# Patient Record
Sex: Male | Born: 1995 | Hispanic: No | Marital: Single | Smoking: Never smoker
Health system: Southern US, Community
[De-identification: ages and names within clinical notes are randomized; demographics above are authoritative.]

## PROBLEM LIST (undated history)

## (undated) DIAGNOSIS — K4090 Unilateral inguinal hernia, without obstruction or gangrene, not specified as recurrent: Secondary | ICD-10-CM

## (undated) DIAGNOSIS — K5900 Constipation, unspecified: Secondary | ICD-10-CM

## (undated) DIAGNOSIS — J302 Other seasonal allergic rhinitis: Secondary | ICD-10-CM

## (undated) DIAGNOSIS — F909 Attention-deficit hyperactivity disorder, unspecified type: Secondary | ICD-10-CM

## (undated) HISTORY — PX: ABDOMINAL SURGERY: SHX537

## (undated) HISTORY — PX: HERNIA REPAIR: SHX51

## (undated) HISTORY — PX: CIRCUMCISION: SUR203

---
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
## 2016-01-06 ENCOUNTER — Encounter: Payer: Self-pay | Admitting: Emergency Medicine

## 2016-01-06 ENCOUNTER — Emergency Department
Admission: EM | Admit: 2016-01-06 | Discharge: 2016-01-06 | Disposition: A | Discharge status: Home / Self Care | Payer: Commercial Managed Care - PPO | Source: Home / Self Care | Attending: Emergency Medicine | Admitting: Emergency Medicine

## 2016-01-06 DIAGNOSIS — S91209A Unspecified open wound of unspecified toe(s) with damage to nail, initial encounter: Secondary | ICD-10-CM

## 2016-01-06 MED ORDER — SULFAMETHOXAZOLE-TRIMETHOPRIM 800-160 MG PO TABS: 1.0000 | ORAL_TABLET | Freq: Two times a day (BID) | ORAL | 0 refills | Status: AC

## 2016-01-06 MED ORDER — NAPROXEN 375 MG PO TABS: 375.0000 mg | ORAL_TABLET | Freq: Two times a day (BID) | ORAL | 0 refills | Status: DC

## 2016-01-06 NOTE — ED Triage Notes (Signed)
[facility] with [facility]/O injury of left great toe. Another person stepped on the toe nail lat night. Left great toe nail is torn from the nail bed.

## 2016-01-07 NOTE — ED Provider Notes (Signed)
[facility]-EMERG[facility]Y DEPT Provider Note   CSN: [id number] Arrival date & time: 01/06/16  1542     History   Chief Complaint Chief Complaint  Patient presents with  . Nail Problem    HPI Nathaniel Lang is a 20 y.o. male.  The history is provided by the patient and a parent. No language interpreter was used.  Toe Pain  This is a new problem. The current episode started yesterday. The problem occurs constantly. The problem has been gradually worsening. Nothing aggravates the symptoms. Nothing relieves the symptoms. He has tried nothing for the symptoms.   Pt was at a concert and someone stepped on his toe,  Pulling nail out and loosening.  Injury appears isolated to toe nail,  Toe not injured Past Medical History:  Diagnosis Date  . ADHD (attention deficit hyperactivity disorder)   . Constipation   . Inguinal hernia   . Seasonal allergies     Patient Active Problem List   Diagnosis Date Noted  . 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 Testis mass 10/15/2014  . ADD (attention deficit disorder) 03/17/2006  . Allergic rhinitis, seasonal 03/17/2006  . 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 ABDOMINAL SURGERY    . CIRCUMCISION    . HERNIA REPAIR    . ORCHIECTOMY Left 10/15/2014   Procedure: left ORCHIECTOMY and right orchiodopexy;  Surgeon: Jethro Bolus, MD;  Location: [facility];  Service: Urology;  Laterality: Left;  . TESTICULAR EXPLORATION N/A 10/15/2014   Procedure: TESTICULAR EXPLORATION;  Surgeon: Jethro Bolus, MD;  Location: [facility];  Service: Urology;  Laterality: N/A;       Home Medications    Prior to Admission medications   Medication Sig Start Date End Date Taking? Authorizing Provider  naproxen (NAPROSYN) 375 MG tablet Take 1 tablet (375 mg total) by mouth 2 (two) times daily. 01/06/16   Elson Areas, PA-C  sulfamethoxazole-trimethoprim (BACTRIM DS,SEPTRA DS)  800-160 MG tablet Take 1 tablet by mouth 2 (two) times daily. 01/06/16 01/13/16  Elson Areas, PA-C    Family History History reviewed. No pertinent family history.  Social History Social History  Substance Use Topics  . Smoking status: Never Smoker  . Smokeless tobacco: Never Used  . Alcohol use No     Allergies   Pertussis vaccines   Review of Systems Review of Systems  All other systems reviewed and are negative.    Physical Exam Updated Vital Signs BP 114/67 (BP Location: Left Arm)   Pulse 89   Temp 97.9 F (36.6 C) (Oral)   Resp 16   Ht 5\' 3"  (1.6 m)   Wt 45.7 kg   SpO2 100%   BMI 17.85 kg/m   Physical Exam  Constitutional: He appears well-developed and well-nourished.  HENT:  Head: Normocephalic and atraumatic.  Eyes: Conjunctivae are normal.  Cardiovascular:  No murmur heard. Pulmonary/Chest: No respiratory distress.  Abdominal: There is no tenderness.  Musculoskeletal: He exhibits no edema.  Neurological: He is alert.  Skin: Skin is warm and dry.  Psychiatric: He has a normal mood and affect.  Nursing note and vitals reviewed.    ED Treatments / Results  Labs (all labs ordered are listed, but only abnormal results are displayed) Labs Reviewed - No data to display  EKG  EKG Interpretation None       Radiology No results found.  Procedures .Nail Removal Date/Time: 01/07/2016 5:23  PM Performed by: Elson AreasSOFIA, Hildy Nicholl K Authorized by: Elson AreasSOFIA, Shoji Pertuit K   Consent:    Consent obtained:  Verbal   Consent given by:  Patient Location:    Foot:  L big toe Pre-procedure details:    Skin preparation:  Betadine Anesthesia (see MAR for exact dosages):    Anesthesia method:  Nerve block   Block needle gauge:  27 G   Block anesthetic:  Bupivacaine 0.5% w/o epi   Block technique:  Digital   Block injection procedure:  Anatomic landmarks identified Nail Removal:    Nail removed:  Complete Post-procedure details:    Dressing:  4x4 sterile  gauze   Patient tolerance of procedure:  Tolerated well, no immediate complications   (including critical care time)  Medications Ordered in ED Medications - No data to display   Initial Impression / Assessment and Plan / ED Course  I have reviewed the triage vital signs and the nursing notes.  Pertinent labs & imaging results that were available during my care of the patient were reviewed by me and considered in my medical decision making (see chart for details).  Clinical Course      Final Clinical Impressions(s) / ED Diagnoses   Final diagnoses:  Avulsion of toenail of left foot    New Prescriptions Discharge Medication List as of 01/06/2016  4:52 PM    START taking these medications   Details  naproxen (NAPROSYN) 375 MG tablet Take 1 tablet (375 mg total) by mouth 2 (two) times daily., Starting Sun 01/06/2016, Print    sulfamethoxazole-trimethoprim (BACTRIM DS,SEPTRA DS) 800-160 MG tablet Take 1 tablet by mouth 2 (two) times daily., Starting Sun 01/06/2016, Until Sun 01/13/2016, Print      I counseled on nail injury,   Pt has bilat nail deformities,  Atrophic nails.   An After Visit Summary was printed and given to the patient.   Elson AreasLeslie K Rosalynn Sergent, PA-C 01/07/16 1726

## 2016-04-18 ENCOUNTER — Encounter: Payer: Self-pay | Admitting: *Deleted

## 2016-04-18 ENCOUNTER — Emergency Department
Admission: EM | Admit: 2016-04-18 | Discharge: 2016-04-18 | Disposition: A | Discharge status: Home / Self Care | Payer: Commercial Managed Care - PPO | Source: Home / Self Care | Attending: Family Medicine | Admitting: Family Medicine

## 2016-04-18 ENCOUNTER — Other Ambulatory Visit: Payer: Self-pay | Admitting: Family Medicine

## 2016-04-18 DIAGNOSIS — Z113 Encounter for screening for infections with a predominantly sexual mode of transmission: Secondary | ICD-10-CM

## 2016-04-18 NOTE — ED Provider Notes (Signed)
[facility]    CSN: [id number] Arrival date & time: 04/18/16  1508     History   Chief Complaint Chief Complaint  Patient presents with  . STD Testing    HPI Nathaniel Lang is a 21 y.o. male.   Patient presents for STD screening, and is assymptomatic at present.  He reports that he has just started a new relationship, although has not had a sexual relationship with new partner.  His previous partner was assymptomatic.   No past history of STD.   The history is provided by the patient.    Past Medical History:  Diagnosis Date  . ADHD (attention deficit hyperactivity disorder)   . Constipation   . Inguinal hernia   . Seasonal allergies     Patient Active Problem List   Diagnosis Date Noted  . 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 Testis mass 10/15/2014  . ADD (attention deficit disorder) 03/17/2006  . Allergic rhinitis, seasonal 03/17/2006  . 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 ABDOMINAL SURGERY    . CIRCUMCISION    . HERNIA REPAIR    . ORCHIECTOMY Left 10/15/2014   Procedure: left ORCHIECTOMY and right orchiodopexy;  Surgeon: Jethro BolusSigmund Tannenbaum, MD;  Location: [facility];  Service: Urology;  Laterality: Left;  . TESTICULAR EXPLORATION N/A 10/15/2014   Procedure: TESTICULAR EXPLORATION;  Surgeon: Jethro BolusSigmund Tannenbaum, MD;  Location: [facility];  Service: Urology;  Laterality: N/A;       Home Medications    Prior to Admission medications   Not on File    Family History History reviewed. No pertinent family history.  Social History Social History  Substance Use Topics  . Smoking status: Never Smoker  . Smokeless tobacco: Never Used  . Alcohol use No     Allergies   Pertussis vaccines   Review of Systems Review of Systems  Constitutional: Negative for activity change, appetite change, chills, diaphoresis, fatigue, fever and unexpected weight change.   HENT: Negative.   Eyes: Negative.   Respiratory: Negative.   Cardiovascular: Negative.   Gastrointestinal: Negative.   Genitourinary: Negative.   Musculoskeletal: Negative.   Skin: Negative.   Neurological: Negative.   Hematological: Negative for adenopathy.     Physical Exam Triage Vital Signs ED Triage Vitals  Enc Vitals Group     BP 04/18/16 1614 117/73     Pulse Rate 04/18/16 1614 80     Resp --      Temp 04/18/16 1614 98.4 F (36.9 C)     Temp Source 04/18/16 1614 Oral     SpO2 04/18/16 1614 97 %     Weight 04/18/16 1615 99 lb (44.9 kg)     Height --      Head Circumference --      Peak Flow --      Pain Score 04/18/16 1615 0     Pain Loc --      Pain Edu? --      Excl. in GC? --    No data found.   Updated Vital Signs BP 117/73 (BP Location: Left Arm)   Pulse 80   Temp 98.4 F (36.9 C) (Oral)   Wt 99 lb (44.9 kg)   SpO2 97%   BMI 17.54 kg/m   Visual Acuity Right Eye Distance:   Left Eye Distance:   Bilateral Distance:    Right Eye Near:   Left  Eye Near:    Bilateral Near:     Physical Exam Nursing notes and Vital Signs reviewed. Appearance:  Patient appears stated age, and in no acute distress Eyes:  Pupils are equal, round, and reactive to light and accomodation.  Extraocular movement is intact.  Conjunctivae are not inflamed  Ears:  Normal externally. Nose: Normal Pharynx:  Normal Neck:  Supple.  No adenopathy. Lungs:  Clear to auscultation.  Breath sounds are equal.  Moving air well. Heart:  Regular rate and rhythm without murmurs, rubs, or gallops.  Abdomen:  Nontender without masses or hepatosplenomegaly.  Bowel sounds are present.  No CVA or flank tenderness.  Extremities:  No edema.  Skin:  No rash present.    UC Treatments / Results  Labs (all labs ordered are listed, but only abnormal results are displayed) Labs Reviewed  GC/CHLAMYDIA PROBE AMP  HSV(HERPES SMPLX)ABS-I+II(IGG+IGM)-BLD  HIV ANTIBODY (ROUTINE TESTING)  RPR     EKG  EKG Interpretation None       Radiology No results found.  Procedures Procedures (including critical care time)  Medications Ordered in UC Medications - No data to display   Initial Impression / Assessment and Plan / UC Course  I have reviewed the triage vital signs and the nursing notes.  Pertinent labs & imaging results that were available during my care of the patient were reviewed by me and considered in my medical decision making (see chart for details).    Patient is completely assymptomatic at present. Check GC/chlamydia, RPR, HSV 1 & 2; HIV    Final Clinical Impressions(s) / UC Diagnoses   Final diagnoses:  Screen for STD (sexually transmitted disease)    New Prescriptions New Prescriptions   No medications on file     Lattie Haw, MD 04/18/16 2008

## 2016-04-18 NOTE — ED Triage Notes (Addendum)
Patient is here for full std screening. He is asymptomatic.   PASSWORD FOR RESULTS: [facility] [id number]

## 2016-04-19 LAB — HIV ANTIBODY (ROUTINE TESTING W REFLEX): HIV 1&2 Ab, 4th Generation: NONREACTIVE

## 2016-04-19 LAB — RPR

## 2016-04-20 ENCOUNTER — Telehealth: Payer: Self-pay | Admitting: Emergency Medicine

## 2016-04-20 NOTE — Telephone Encounter (Signed)
Pt called regarding his results.  HIV and RPR results back and given to pt.  Other labs still pending.  TMartin,CMA

## 2016-04-21 LAB — GC/CHLAMYDIA PROBE AMP
CT Probe RNA: NOT DETECTED
GC Probe RNA: NOT DETECTED

## 2016-04-22 ENCOUNTER — Telehealth: Payer: Self-pay | Admitting: *Deleted

## 2016-04-22 NOTE — Telephone Encounter (Signed)
Password received, advised of all negative results thus far. HSV blood work is still pending.

## 2016-04-22 NOTE — Telephone Encounter (Addendum)
[facility] stating that there was a "specimen problem". They were questioning if the only blood work needed was HIV and RPR. Per Zella Ballobin she did not see an order for the HSV Igg and Igm. Robin added the lab test.

## 2016-04-25 LAB — HSV TYPE I/II IGG, IGMW/ REFLEX
HSV 1 IGM SCREEN: NEGATIVE
HSV 2 IgM Screen: NEGATIVE

## 2016-04-26 ENCOUNTER — Telehealth: Payer: Self-pay | Admitting: Emergency Medicine

## 2016-08-13 IMAGING — CR DG ABDOMEN 2V
2 series · 2 of 2 positions shown · non-contrast
Comparison: None.

CLINICAL DATA: 18-year-old male with mid and left-sided abdominal
pain as well as nausea

EXAM:
ABDOMEN - 2 VIEW

[w abdomen upright]
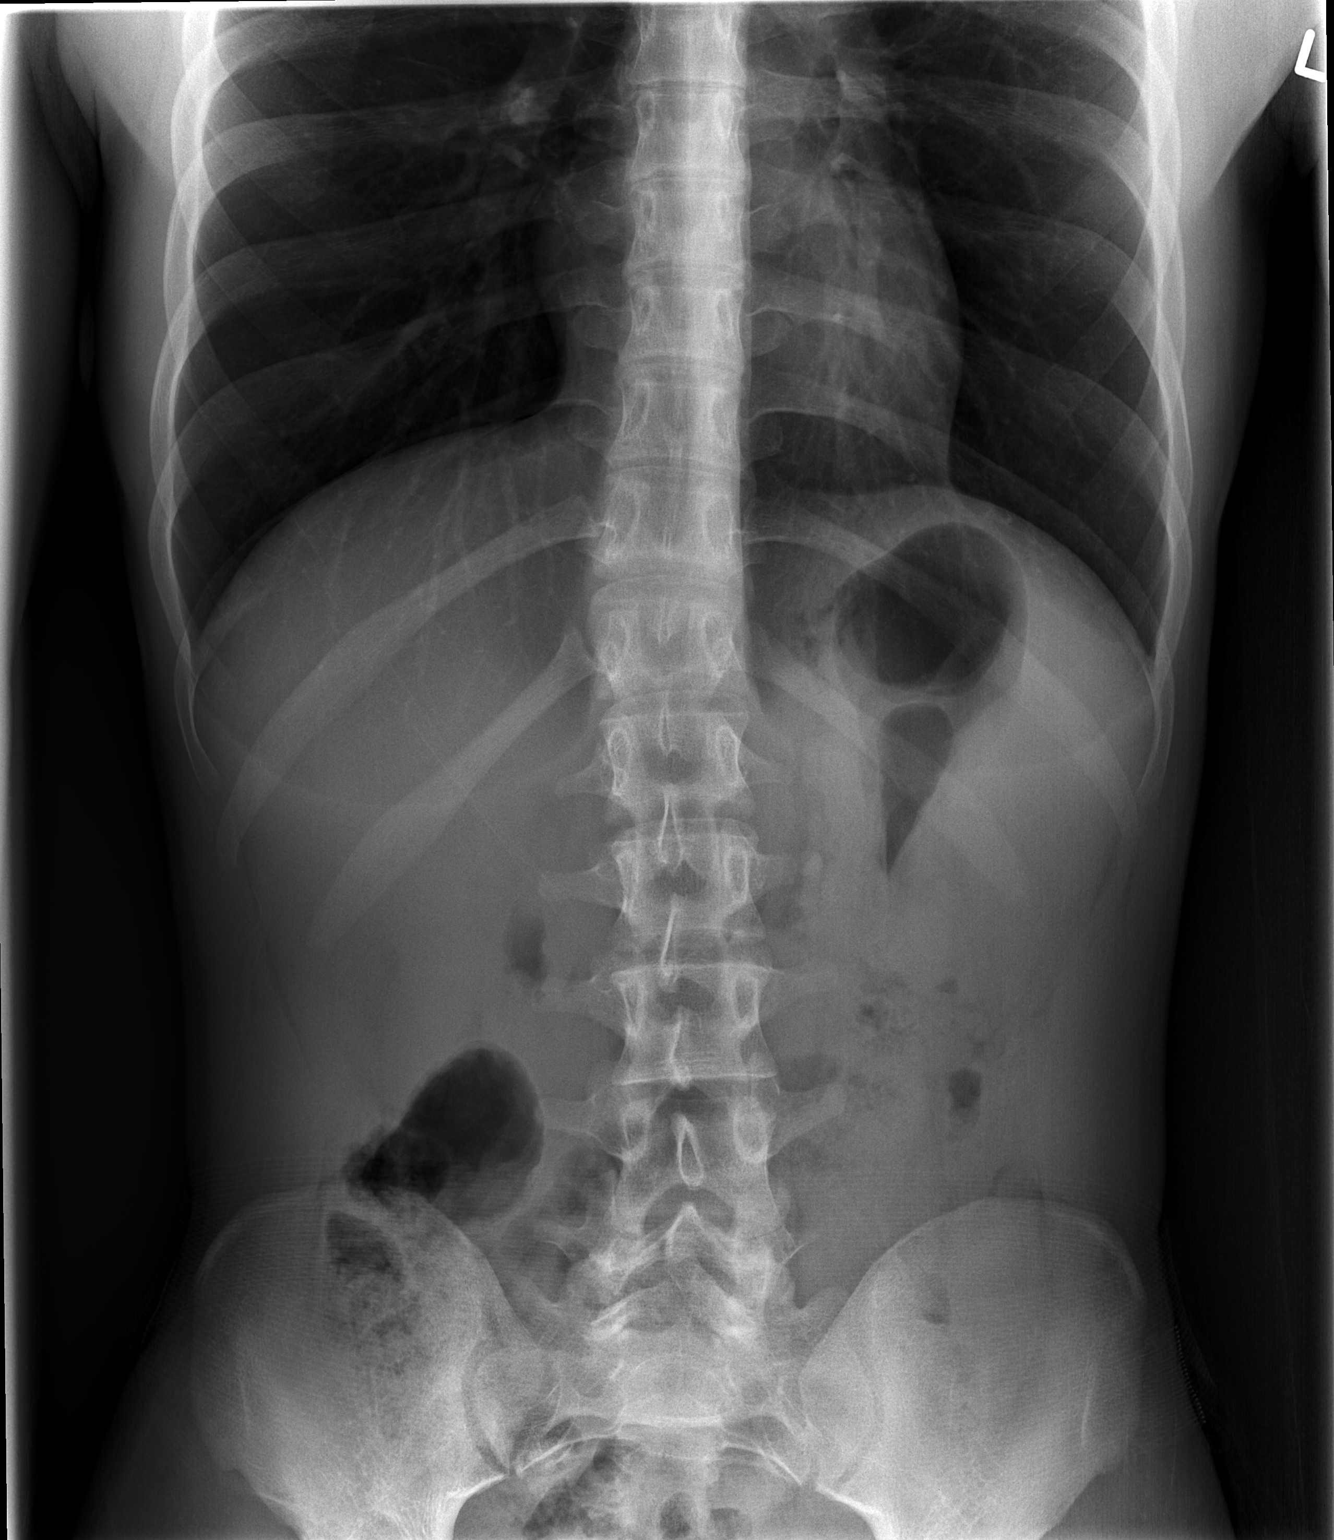

[t abdomen supine]
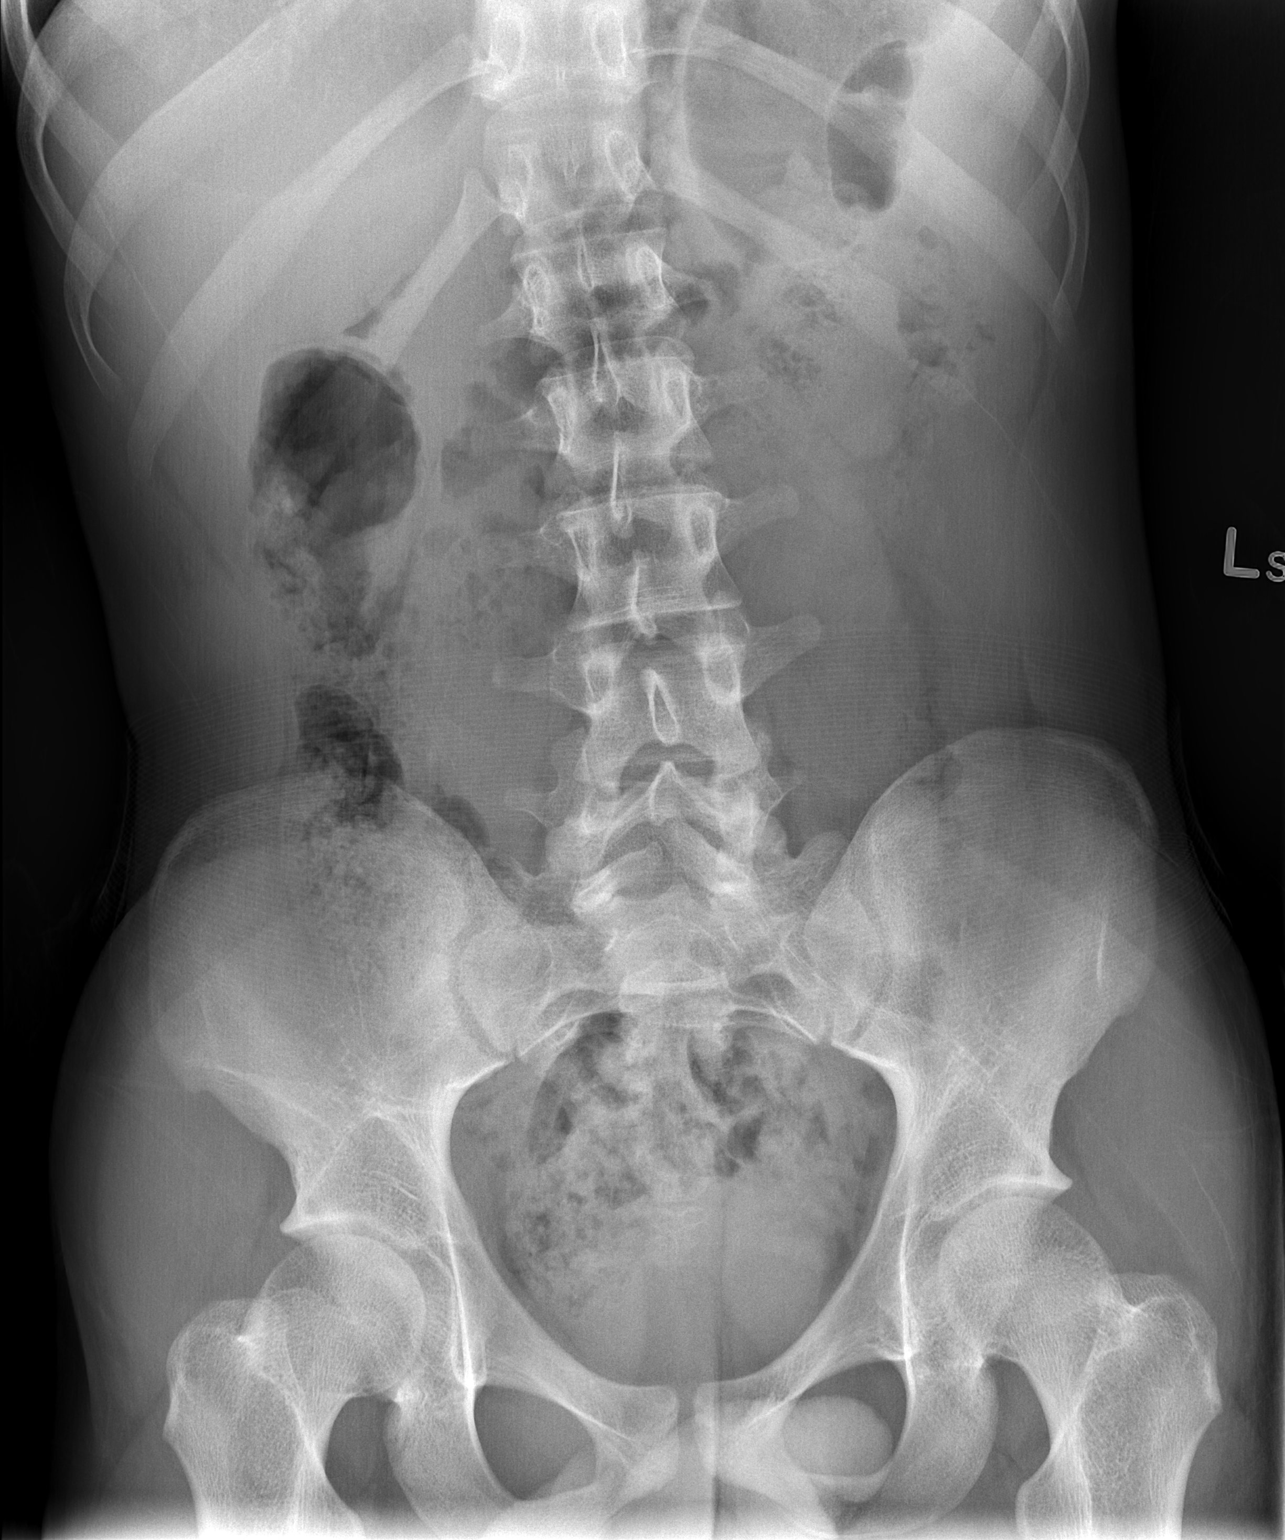

[2 of 2 positions shown; findings below may reference images not displayed]

FINDINGS: The bowel gas pattern is normal. Unremarkable colonic stool burden.
There is no evidence of free air. No radio-opaque calculi or other
significant radiographic abnormality is seen.
IMPRESSION: Negative.

## 2016-08-30 IMAGING — US US SCROTUM
1 series · 13 of 25 positions shown · non-contrast
Comparison: None.

CLINICAL DATA: Left testicular swelling and pain, sudden onset
yesterday. Tender to palpation.

EXAM:
SCROTAL ULTRASOUND
DOPPLER ULTRASOUND OF THE TESTICLES
TECHNIQUE: Complete ultrasound examination of the testicles, epididymis, and
other scrotal structures was performed. Color and spectral Doppler
ultrasound were also utilized to evaluate blood flow to the
testicles.

[Series 1: us scrotum · 0.08mm/px · 78 acquisitions, 13 frames shown]
[im 1/78]
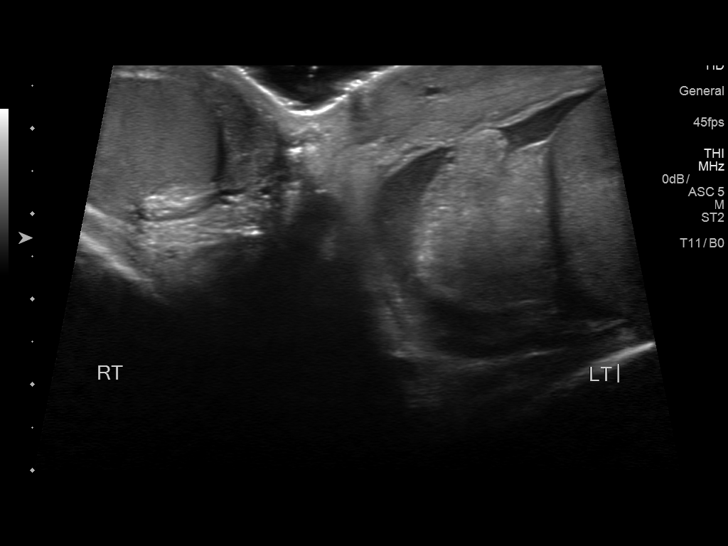
[im 7/78]
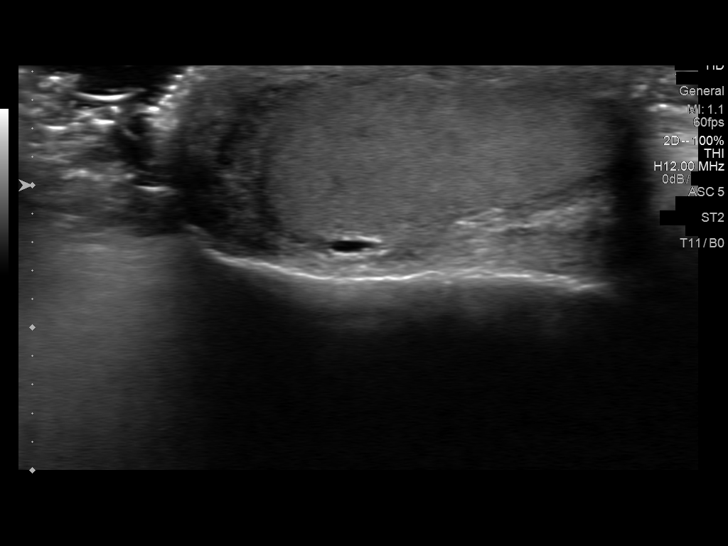
[im 13/78]
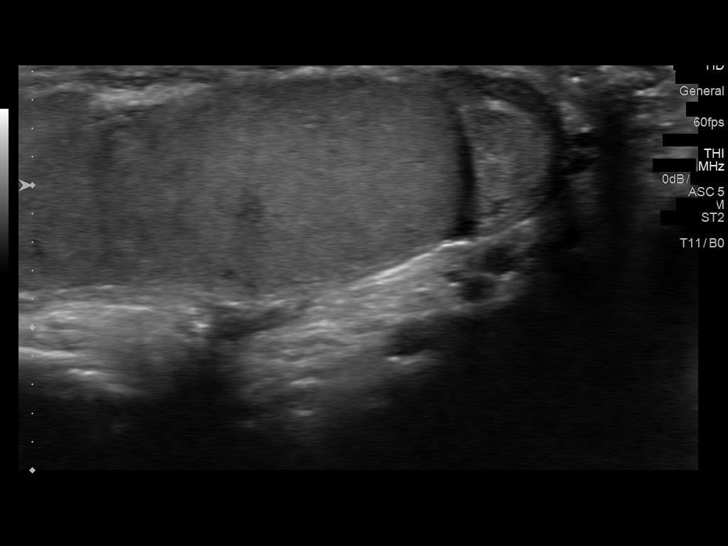
[im 20/78]
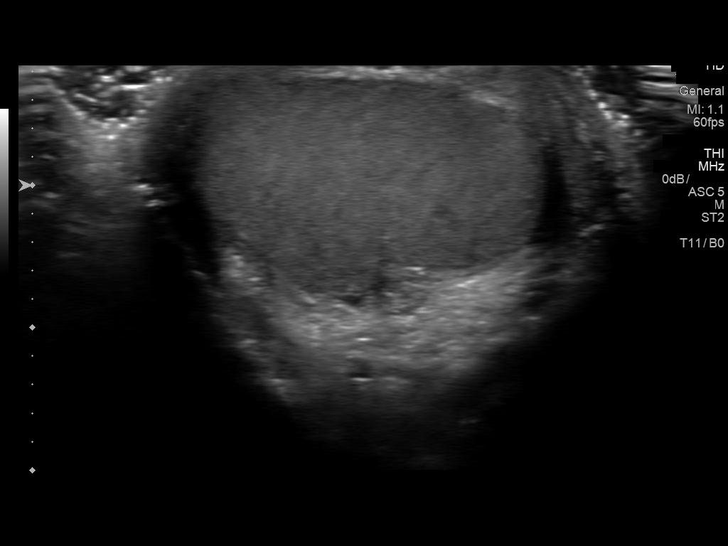
[im 26/78]
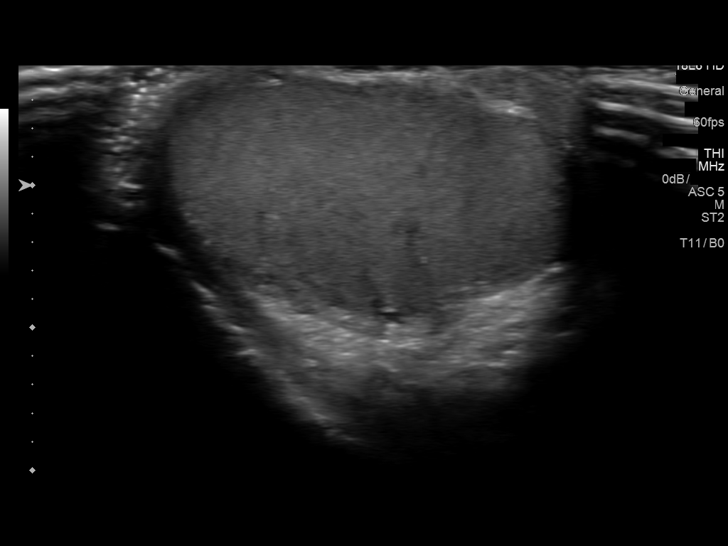
[im 33/78]
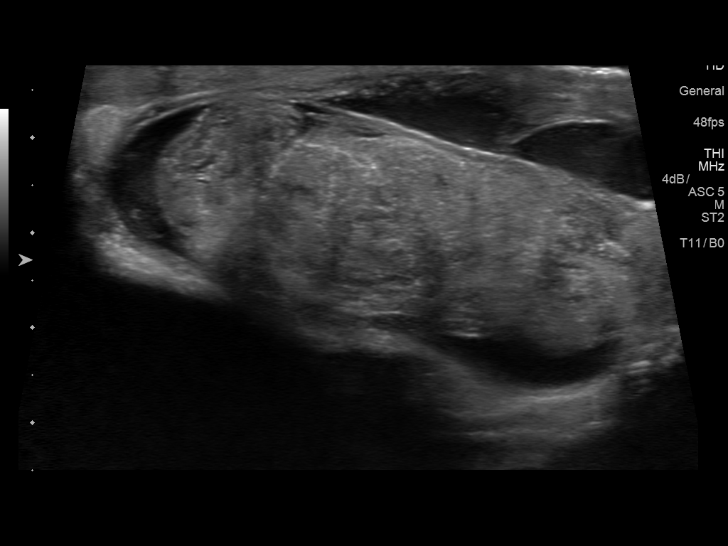
[im 39/78]
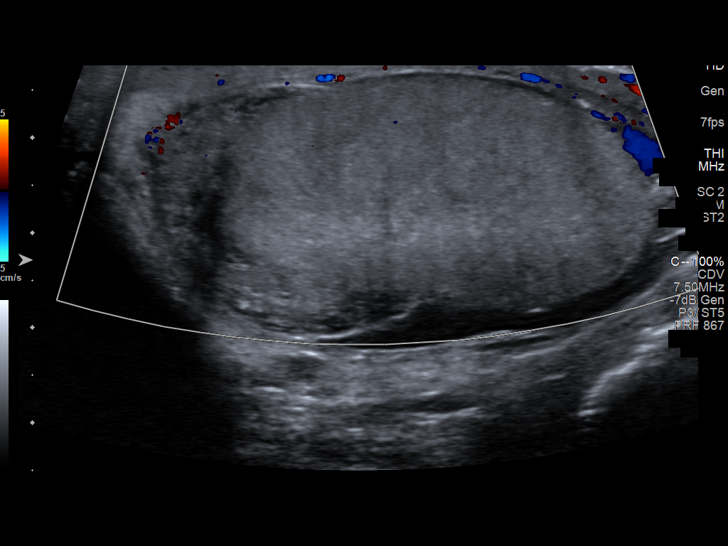
[im 45/78]
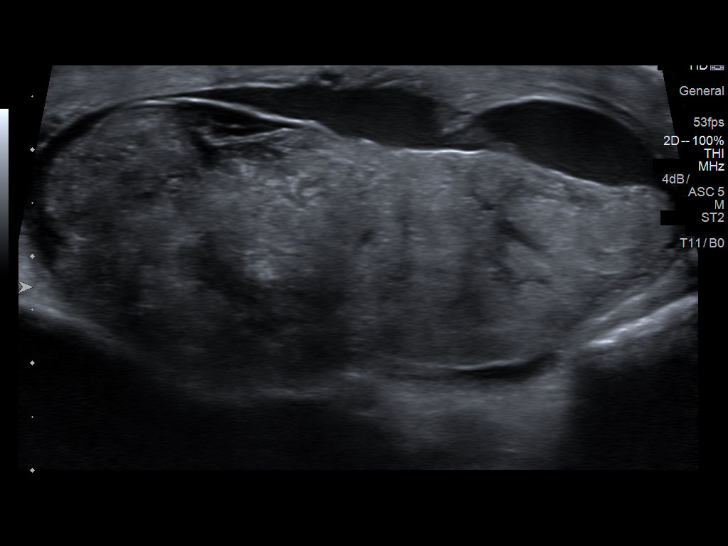
[im 52/78]
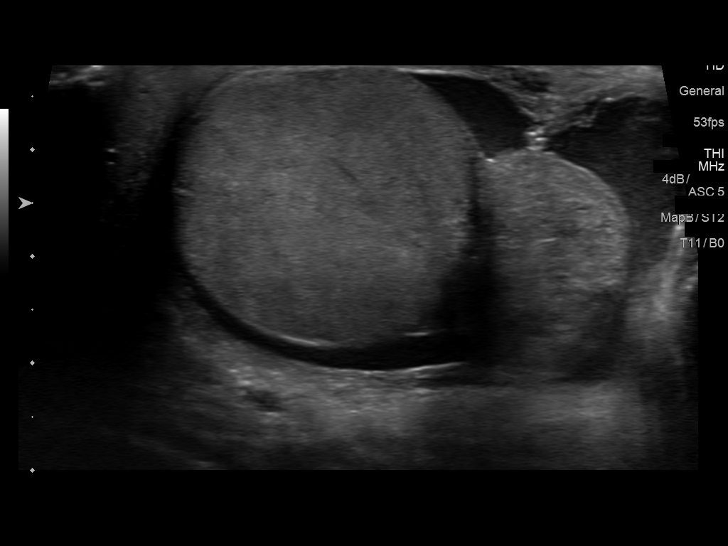
[im 58/78]
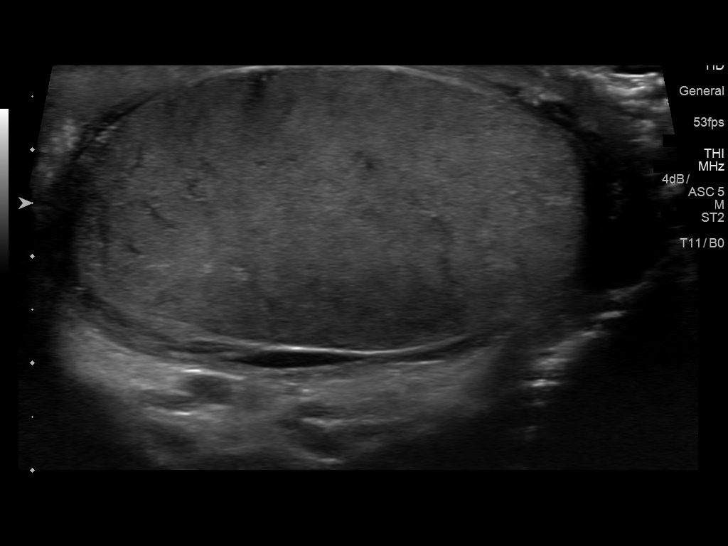
[im 65/78]
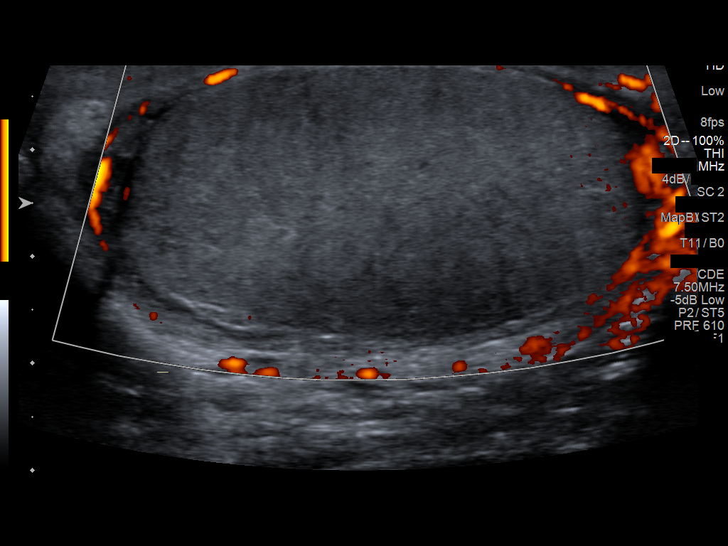
[im 71/78]
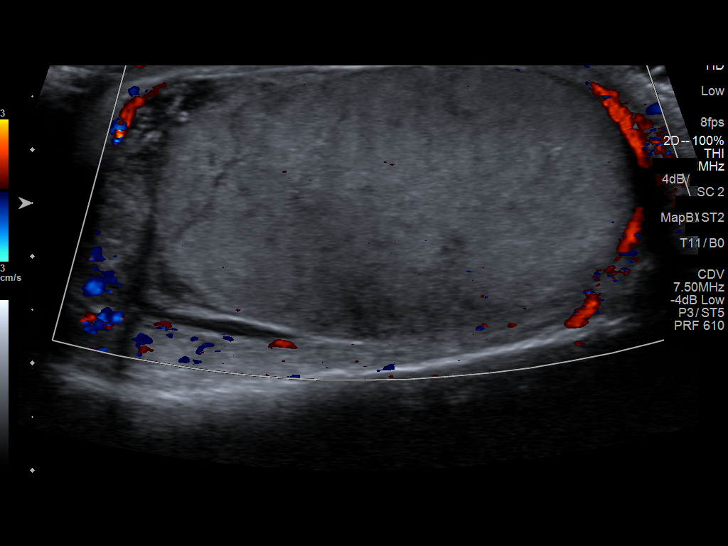
[im 78/78]
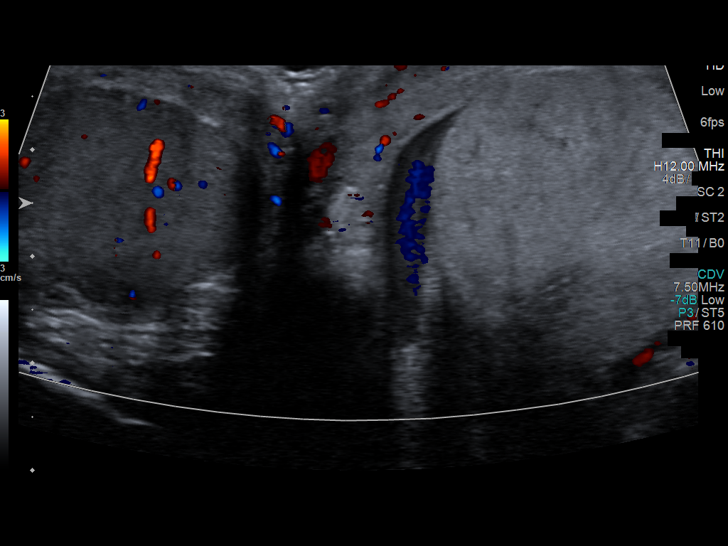

[13 of 25 positions shown; findings below may reference images not displayed]

FINDINGS: Right testicle

Measurements: 4.1 x 1.4 x 2.5 cm. No mass or microlithiasis
visualized.

Left testicle

Measurements: 4.9 x 2.5 x 2.8 cm. The testis is enlarged and appears
edematous. No arterial flow is detected on Doppler.

Right epididymis:  Normal in size and appearance.

Left epididymis:  The left epididymis is edematous.

Hydrocele:  Small bilateral hydroceles.

Varicocele:  None visualized.

Pulsed Doppler interrogation of both testes demonstrates normal low
resistance arterial and venous waveforms on the right. We were
unable to detect arterial flow on the left.
IMPRESSION: No detectable arterial flow to the left testis or epididymis,
concerning for torsion. These results were called by telephone at
the time of interpretation on 10/14/2014 at [DATE] to Dr. MIKESON VI
ES , who verbally acknowledged these results.

## 2016-11-29 ENCOUNTER — Encounter: Payer: Self-pay | Admitting: Emergency Medicine

## 2016-11-29 ENCOUNTER — Emergency Department
Admission: EM | Admit: 2016-11-29 | Discharge: 2016-11-29 | Disposition: A | Discharge status: Home / Self Care | Payer: Commercial Managed Care - PPO | Source: Home / Self Care | Attending: Family Medicine | Admitting: Family Medicine

## 2016-11-29 DIAGNOSIS — Z202 Contact with and (suspected) exposure to infections with a predominantly sexual mode of transmission: Secondary | ICD-10-CM | POA: Diagnosis not present

## 2016-11-29 MED ORDER — CEFTRIAXONE SODIUM 250 MG IJ SOLR
250.0000 mg | Freq: Once | INTRAMUSCULAR | Status: AC
  Administered 2016-11-29: 250 mg via INTRAMUSCULAR

## 2016-11-29 NOTE — ED Provider Notes (Signed)
[facility]    CSN: [id number] Arrival date & time: 11/29/16  0947     History   Chief Complaint Chief Complaint  Patient presents with  . Exposure to STD    HPI Nathaniel Lang is a 21 y.o. male.   Patient reports that about 2 weeks ago he was possible exposed to [facility]  He is assymptomatic at present but requests treatment.   The history is provided by the patient.    Past Medical History:  Diagnosis Date  . ADHD (attention deficit hyperactivity disorder)   . Constipation   . Inguinal hernia   . Seasonal allergies     Patient Active Problem List   Diagnosis Date Noted  . 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 Testis mass 10/15/2014  . ADD (attention deficit disorder) 03/17/2006  . Allergic rhinitis, seasonal 03/17/2006  . 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 ABDOMINAL SURGERY    . CIRCUMCISION    . HERNIA REPAIR    . ORCHIECTOMY Left 10/15/2014   Procedure: left ORCHIECTOMY and right orchiodopexy;  Surgeon: Jethro Bolus, MD;  Location: [facility];  Service: Urology;  Laterality: Left;  . TESTICULAR EXPLORATION N/A 10/15/2014   Procedure: TESTICULAR EXPLORATION;  Surgeon: Jethro Bolus, MD;  Location: [facility];  Service: Urology;  Laterality: N/A;       Home Medications    Prior to Admission medications   Not on File    Family History History reviewed. No pertinent family history.  Social History Social History  Substance Use Topics  . Smoking status: Never Smoker  . Smokeless tobacco: Never Used  . Alcohol use No     Allergies   Pertussis vaccines   Review of Systems Review of Systems  Constitutional: Negative for chills, diaphoresis, fatigue and fever.  Genitourinary: Negative for discharge, dysuria, flank pain, frequency, genital sores, hematuria, penile swelling, scrotal swelling, testicular pain and urgency.  Musculoskeletal:  Negative for arthralgias.  Skin: Negative for rash.  All other systems reviewed and are negative.    Physical Exam Triage Vital Signs ED Triage Vitals  Enc Vitals Group     BP      Pulse      Resp      Temp      Temp src      SpO2      Weight      Height      Head Circumference      Peak Flow      Pain Score      Pain Loc      Pain Edu?      Excl. in GC?    No data found.   Updated Vital Signs BP 109/70 (BP Location: Left Arm)   Pulse 68   Temp 98.4 F (36.9 C) (Oral)   Resp 16   Ht  (1.6 m)   Wt 100 lb (45.4 kg)   SpO2 100%   BMI 17.71 kg/m   Visual Acuity Right Eye Distance:   Left Eye Distance:   Bilateral Distance:    Right Eye Near:   Left Eye Near:    Bilateral Near:     Physical Exam Nursing notes and Vital Signs reviewed. Appearance:  Patient appears stated age, and in no acute distress.    Eyes:  Pupils are equal, round, and reactive to light and accomodation.  Extraocular movement is intact.  Conjunctivae are not inflamed   Pharynx:  Normal; moist mucous membranes  Neck:  Supple.  No adenopathy Lungs:  Clear to auscultation.  Breath sounds are equal.  Moving air well. Heart:  Regular rate and rhythm without murmurs, rubs, or gallops.  Abdomen:  Nontender without masses or hepatosplenomegaly.  Bowel sounds are present.  No CVA or flank tenderness.  Extremities:  No edema.  Skin:  No rash present.     UC Treatments / Results  Labs (all labs ordered are listed, but only abnormal results are displayed) Labs Reviewed  C. TRACHOMATIS/N. GONORRHOEAE RNA  GC/CHLAMYDIA PROBE AMP    EKG  EKG Interpretation None       Radiology No results found.  Procedures Procedures (including critical care time)  Medications Ordered in UC Medications  cefTRIAXone (ROCEPHIN) injection 250 mg (250 mg Intramuscular Given 11/29/16 1121)     Initial Impression / Assessment and Plan / UC Course  I have reviewed the triage vital signs and the  nursing notes.  Pertinent labs & imaging results that were available during my care of the patient were reviewed by me and considered in my medical decision making (see chart for details).    Administered Rocephin  IM GC/chlamydia pending. Avoid sexual contact until test results available. Followup with Family Doctor if not improved in about 5 days.    Final Clinical Impressions(s) / UC Diagnoses   Final diagnoses:  STD exposure    New Prescriptions There are no discharge medications for this patient.        Lattie Haw, MD 12/06/16 [phone]

## 2016-11-29 NOTE — ED Triage Notes (Signed)
Patient was exposed to [place] 2 weeks ago; uncertain symptoms, but requesting treatment.

## 2016-11-29 NOTE — Discharge Instructions (Signed)
Avoid sexual contact until test results available.

## 2016-12-02 ENCOUNTER — Telehealth: Payer: Self-pay | Admitting: *Deleted

## 2016-12-02 LAB — C. TRACHOMATIS/N. GONORRHOEAE RNA
C. trachomatis RNA, TMA: NOT DETECTED
N. gonorrhoeae RNA, TMA: NOT DETECTED

## 2016-12-02 NOTE — Telephone Encounter (Signed)
Spoke to pt password verified and lab results given. Clemens Catholic, LPN

## 2017-11-18 ENCOUNTER — Encounter: Payer: Self-pay | Admitting: *Deleted

## 2017-11-18 ENCOUNTER — Emergency Department (INDEPENDENT_AMBULATORY_CARE_PROVIDER_SITE_OTHER)
Admission: EM | Admit: 2017-11-18 | Discharge: 2017-11-18 | Disposition: A | Discharge status: Home / Self Care | Payer: BLUE CROSS/BLUE SHIELD | Source: Home / Self Care | Attending: Family Medicine | Admitting: Family Medicine

## 2017-11-18 DIAGNOSIS — H15102 Unspecified episcleritis, left eye: Secondary | ICD-10-CM

## 2017-11-18 MED ORDER — KETOROLAC TROMETHAMINE 0.5 % OP SOLN: 1.0000 [drp] | Freq: Four times a day (QID) | OPHTHALMIC | 0 refills | Status: DC

## 2017-11-18 NOTE — Discharge Instructions (Addendum)
Try using refrigerated lubricating eye drops (such as Refresh tears) several times daily.

## 2017-11-18 NOTE — ED Provider Notes (Signed)
[facility]    CSN: [id number] Arrival date & time: 11/18/17  1043     History   Chief Complaint Chief Complaint  Patient presents with  . Eye Problem    HPI Nathaniel Lang is a 22 y.o. male.   Patient complains of onset of mild redness in his left eye 3 days ago, and a gritty sensation in the left eye, now resolved.  He recalls no foreign body to the left eye.  He first noted some morning "crusting" on his eyelids, and later developed mucoid discharge during the day.  No changes in vision.  No sinus congestion.  No recent URI.  He does not wear contact lens.  The history is provided by the patient.    Past Medical History:  Diagnosis Date  . ADHD (attention deficit hyperactivity disorder)   . Constipation   . Inguinal hernia   . Seasonal allergies     Patient Active Problem List   Diagnosis Date Noted  . 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 Testis mass 10/15/2014  . ADD (attention deficit disorder) 03/17/2006  . Allergic rhinitis, seasonal 03/17/2006  . 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 ABDOMINAL SURGERY    . CIRCUMCISION    . HERNIA REPAIR    . ORCHIECTOMY Left 10/15/2014   Procedure: left ORCHIECTOMY and right orchiodopexy;  Surgeon: Jethro Bolus, MD;  Location: [facility];  Service: Urology;  Laterality: Left;  . TESTICULAR EXPLORATION N/A 10/15/2014   Procedure: TESTICULAR EXPLORATION;  Surgeon: Jethro Bolus, MD;  Location: [facility];  Service: Urology;  Laterality: N/A;       Home Medications    Prior to Admission medications   Medication Sig Start Date End Date Taking? Authorizing Provider  ketorolac (ACULAR) 0.5 % ophthalmic solution Place 1 drop into the left eye 4 (four) times daily. 11/18/17   Lattie Haw, MD    Family History Family History  Problem Relation Age of Onset  . Healthy Mother   . Healthy Father     Social  History Social History   Tobacco Use  . Smoking status: Never Smoker  . Smokeless tobacco: Never Used  Substance Use Topics  . Alcohol use: No  . Drug use: No     Allergies   Pertussis vaccines   Review of Systems Review of Systems No sore throat No cough No pleuritic pain No wheezing No nasal congestion No post-nasal drainage No sinus pain/pressure + itchy/red left eye No earache No hemoptysis No SOB No fever/chills No nausea No vomiting No abdominal pain No diarrhea No urinary symptoms No skin rash No fatigue No myalgias No headache    Physical Exam Triage Vital Signs ED Triage Vitals  Enc Vitals Group     BP 11/18/17 1127 108/72     Pulse Rate 11/18/17 1127 61     Resp 11/18/17 1127 16     Temp 11/18/17 1127 98.3 F (36.8 C)     Temp Source 11/18/17 1127 Oral     SpO2 11/18/17 1127 99 %     Weight 11/18/17 1128 106 lb 4 oz (48.2 kg)     Height 11/18/17 1128 5\' 4"  (1.626 m)     Head Circumference --      Peak Flow --      Pain Score 11/18/17 1128 0     Pain Loc --  Pain Edu? --      Excl. in GC? --    No data found.  Updated Vital Signs BP 108/72 (BP Location: Right Arm)   Pulse 61   Temp 98.3 F (36.8 C) (Oral)   Resp 16   Ht 5\' 4"  (1.626 m)   Wt 48.2 kg   SpO2 99%   BMI 18.24 kg/m   Visual Acuity Right Eye Distance: 20/15 Left Eye Distance: 20/20 Bilateral Distance: 20/20  Right Eye Near:   Left Eye Near:    Bilateral Near:     Physical Exam Nursing notes and Vital Signs reviewed. Appearance:  Patient appears stated age, and in no acute distress Eyes:  Pupils are equal, round, and reactive to light and accomodation.  Extraocular movement is intact.  Conjunctivae are not inflamed.  No discharge noted.  Fundi benign. There is minimal episcleral injection left eye.  No photophobia.  No left lid swelling, erythema, or tenderness.  Left lid eversion reveals no foreign body.  Fluorescein to left eye shows no uptake.  Ears:   Canals normal.  Tympanic membranes normal.  Nose:  Mildly congested turbinates.  No sinus tenderness.   Pharynx:  Normal Neck:  Supple.  No adenopathy Lungs:  Clear to auscultation.   Heart:  Regular rate and rhythm without murmurs, rubs, or gallops.   Extremities:  No edema.  Skin:  No rash present.    UC Treatments / Results  Labs (all labs ordered are listed, but only abnormal results are displayed) Labs Reviewed - No data to display  EKG None  Radiology No results found.  Procedures Procedures (including critical care time)  Medications Ordered in UC Medications - No data to display  Initial Impression / Assessment and Plan / UC Course  I have reviewed the triage vital signs and the nursing notes.  Pertinent labs & imaging results that were available during my care of the patient were reviewed by me and considered in my medical decision making (see chart for details).    Begin Acular. Followup with ophthalmologist if not improved one week.   Final Clinical Impressions(s) / UC Diagnoses   Final diagnoses:  Episcleritis of left eye     Discharge Instructions     Try using refrigerated lubricating eye drops (such as Refresh tears) several times daily.    ED Prescriptions    Medication Sig Dispense Auth. Provider   ketorolac (ACULAR) 0.5 % ophthalmic solution Place 1 drop into the left eye 4 (four) times daily. 3 mL Lattie Haw, MD         Lattie Haw, MD 11/18/17 [phone]

## 2017-11-18 NOTE — ED Triage Notes (Signed)
C/o yellow drainage, redness, and "scratchy" sensation to left eye x2 days. Denies injury.

## 2017-12-23 ENCOUNTER — Encounter: Payer: Self-pay | Admitting: Osteopathic Medicine

## 2017-12-23 ENCOUNTER — Ambulatory Visit (INDEPENDENT_AMBULATORY_CARE_PROVIDER_SITE_OTHER): Payer: BLUE CROSS/BLUE SHIELD | Admitting: Osteopathic Medicine

## 2017-12-23 VITALS — BP 110/63 | HR 58 | Temp 98.2°F | Wt 109.8 lb

## 2017-12-23 DIAGNOSIS — H6123 Impacted cerumen, bilateral: Secondary | ICD-10-CM | POA: Diagnosis not present

## 2017-12-23 DIAGNOSIS — H6121 Impacted cerumen, right ear: Secondary | ICD-10-CM | POA: Diagnosis not present

## 2017-12-23 NOTE — Progress Notes (Signed)
HPI: Nathaniel Lang is a 22 y.o. male who  has a past medical history of ADHD (attention deficit hyperactivity disorder), Constipation, Inguinal hernia, and Seasonal allergies.  he presents to [facility] today, 12/23/17,  for chief complaint of: Ear problem   Head congestion, ears feel stopped up, bad on the R side. Went to concert last Monday and after that felt like something in his R ear, felt like he might have pushed something into it. Ear drops and cotton balls no relief.     Past medical, surgical, social and family history reviewed:  Patient Active Problem List   Diagnosis Date Noted  . 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 Testis mass 10/15/2014  . ADD (attention deficit disorder) 03/17/2006  . Allergic rhinitis, seasonal 03/17/2006  . 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 ABDOMINAL SURGERY    . CIRCUMCISION    . HERNIA REPAIR    . ORCHIECTOMY Left 10/15/2014   Procedure: left ORCHIECTOMY and right orchiodopexy;  Surgeon: Jethro Bolus, MD;  Location: [facility];  Service: Urology;  Laterality: Left;  . TESTICULAR EXPLORATION N/A 10/15/2014   Procedure: TESTICULAR EXPLORATION;  Surgeon: Jethro Bolus, MD;  Location: [facility];  Service: Urology;  Laterality: N/A;    Social History   Tobacco Use  . Smoking status: Never Smoker  . Smokeless tobacco: Never Used  Substance Use Topics  . Alcohol use: No    Family History  Problem Relation Age of Onset  . Healthy Mother   . Healthy Father      Current medication list and allergy/intolerance information reviewed:    Current Outpatient Medications  Medication Sig Dispense Refill  . ketorolac (ACULAR) 0.5 % ophthalmic solution Place 1 drop into the left eye 4 (four) times daily. 3 mL 0   No current facility-administered medications for this visit.     Allergies  Allergen  Reactions  . Pertussis Vaccines Other (See Comments)    Experienced seizure with dose in past.  Has been able to tolerate pertussis vaccine more recently.       Review of Systems:  Constitutional:  No  fever, no chills, No recent illness  HEENT: No  headache, no vision change, no hearing change, No sore throat, No  sinus pressure  Cardiac: No  chest pain, No  pressure, No palpitations  Respiratory:  No  shortness of breath. No  Cough  Skin: No  Rash  Neurologic: No  weakness, No  dizziness  Exam:  BP 110/63 (BP Location: Left Arm, Patient Position: Sitting, Cuff Size: Normal)   Pulse (!) 58   Temp 98.2 F (36.8 C) (Oral)   Wt 109 lb 12.8 oz (49.8 kg)   SpO2 100%   BMI 18.85 kg/m   Constitutional: VS see above. General Appearance: alert, well-developed, well-nourished, NAD  Eyes: Normal lids and conjunctive, non-icteric sclera  Ears, Nose, Mouth, Throat: MMM, Normal external inspection ears/nares/mouth/lips/gums. TM on initial exam obscured by cerumen on right.  Obscured as well on left.  Patient requested flushing of the right ear, this was performed successfully by nurse and tympanic membrane on the right appeared normal after evacuation of cerumen.  Left still had some cerumen..  Neck: No masses, trachea midline. No tenderness/mass appreciated. No lymphadenopathy  Respiratory: Normal respiratory effort.  Skin: warm, dry, intact.   Psychiatric: Normal judgment/insight. Normal mood and affect.  ASSESSMENT/PLAN: The primary encounter diagnosis was Impacted cerumen of right ear. A diagnosis of Bilateral impacted cerumen was also pertinent to this visit.      Visit summary with medication list and pertinent instructions was printed for patient to review. All questions at time of visit were answered - patient instructed to contact office with any additional concerns. ER/RTC precautions were reviewed with the patient.   Follow-up plan: Return if symptoms worsen or  fail to improve.    Please note: voice recognition software was used to produce this document, and typos may escape review. Please contact Dr. Lyn Hollingshead for any needed clarifications.

## 2018-03-26 ENCOUNTER — Encounter: Payer: Self-pay | Admitting: Emergency Medicine

## 2018-03-26 ENCOUNTER — Emergency Department
Admission: EM | Admit: 2018-03-26 | Discharge: 2018-03-26 | Disposition: A | Discharge status: Home / Self Care | Payer: Self-pay | Source: Home / Self Care | Attending: Family Medicine | Admitting: Family Medicine

## 2018-03-26 ENCOUNTER — Other Ambulatory Visit: Payer: Self-pay

## 2018-03-26 DIAGNOSIS — Z711 Person with feared health complaint in whom no diagnosis is made: Secondary | ICD-10-CM

## 2018-03-26 NOTE — ED Provider Notes (Signed)
[facility]    CSN: [id number] Arrival date & time: 03/26/18  1615     History   Chief Complaint Chief Complaint  Patient presents with  . Exposure to STD    HPI Nathaniel BashRicky A Marotto is a 23 y.o. male.   HPI Nathaniel Lang is a 23 y.o. male presenting to UC with request for STI check after finding out his significant other had sex with another man. Pt denies symptoms.    Past Medical History:  Diagnosis Date  . ADHD (attention deficit hyperactivity disorder)   . Constipation   . Inguinal hernia   . Seasonal allergies     Patient Active Problem List   Diagnosis Date Noted  . 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 Testis mass 10/15/2014  . ADD (attention deficit disorder) 03/17/2006  . Allergic rhinitis, seasonal 03/17/2006  . 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 ABDOMINAL SURGERY    . CIRCUMCISION    . HERNIA REPAIR    . ORCHIECTOMY Left 10/15/2014   Procedure: left ORCHIECTOMY and right orchiodopexy;  Surgeon: Jethro BolusSigmund Tannenbaum, MD;  Location: [facility];  Service: Urology;  Laterality: Left;  . TESTICULAR EXPLORATION N/A 10/15/2014   Procedure: TESTICULAR EXPLORATION;  Surgeon: Jethro BolusSigmund Tannenbaum, MD;  Location: [facility];  Service: Urology;  Laterality: N/A;       Home Medications    Prior to Admission medications   Medication Sig Start Date End Date Taking? Authorizing Provider  ketorolac (ACULAR) 0.5 % ophthalmic solution Place 1 drop into the left eye 4 (four) times daily. Patient not taking: Reported on 12/23/2017 11/18/17   Lattie HawBeese, Stephen A, MD    Family History Family History  Problem Relation Age of Onset  . Healthy Mother   . Healthy Father     Social History Social History   Tobacco Use  . Smoking status: Never Smoker  . Smokeless tobacco: Never Used  Substance Use Topics  . Alcohol use: No  . Drug use: No     Allergies   Pertussis  vaccines   Review of Systems Review of Systems  Constitutional: Negative for chills and fever.  Genitourinary: Negative for discharge and dysuria.  Skin: Negative for rash.     Physical Exam Triage Vital Signs ED Triage Vitals  Enc Vitals Group     BP 03/26/18 1639 106/65     Pulse Rate 03/26/18 1639 64     Resp 03/26/18 1639 18     Temp 03/26/18 1639 98.2 F (36.8 C)     Temp Source 03/26/18 1639 Oral     SpO2 03/26/18 1639 99 %     Weight 03/26/18 1640 113 lb (51.3 kg)     Height 03/26/18 1640 5\' 3"  (1.6 m)     Head Circumference --      Peak Flow --      Pain Score 03/26/18 1640 0     Pain Loc --      Pain Edu? --      Excl. in GC? --    No data found.  Updated Vital Signs BP 106/65 (BP Location: Right Arm)   Pulse 64   Temp 98.2 F (36.8 C) (Oral)   Resp 18   Ht 5\' 3"  (1.6 m)   Wt 113 lb (51.3 kg)   SpO2 99%   BMI 20.02 kg/m   Visual Acuity Right Eye Distance:  Left Eye Distance:   Bilateral Distance:    Right Eye Near:   Left Eye Near:    Bilateral Near:     Physical Exam Vitals signs and nursing note reviewed.  Constitutional:      Appearance: He is well-developed.  HENT:     Head: Normocephalic.  Cardiovascular:     Rate and Rhythm: Normal rate.  Pulmonary:     Effort: Pulmonary effort is normal.  Skin:    General: Skin is warm and dry.  Neurological:     Mental Status: He is alert and oriented to person, place, and time.  Psychiatric:        Behavior: Behavior normal.      UC Treatments / Results  Labs (all labs ordered are listed, but only abnormal results are displayed) Labs Reviewed  C. TRACHOMATIS/N. GONORRHOEAE RNA  RPR  HIV ANTIBODY (ROUTINE TESTING W REFLEX)  URINE CYTOLOGY ANCILLARY ONLY    EKG None  Radiology No results found.  Procedures Procedures (including critical care time)  Medications Ordered in UC Medications - No data to display  Initial Impression / Assessment and Plan / UC Course  I have  reviewed the triage vital signs and the nursing notes.  Pertinent labs & imaging results that were available during my care of the patient were reviewed by me and considered in my medical decision making (see chart for details).     Urine and blood samples sent to lab.   Final Clinical Impressions(s) / UC Diagnoses   Final diagnoses:  Concern about STD in male without diagnosis     Discharge Instructions      Refrain from sexual activity including intercourse until lab results are back.  If you need to be treated, wait at least 7 days after treatment before resuming sexual activity.  Be sure to have all partners tested and treated for STDs.  Practice safe sex by always wearing condoms.      ED Prescriptions    None     Controlled Substance Prescriptions [place] Controlled Substance Registry consulted? Not Applicable   Rolla Plate 03/26/18 3419

## 2018-03-26 NOTE — ED Triage Notes (Signed)
Patient reports unprotected intercourse 5 days ago and contact unknown std status and he desires testing. No symptoms.

## 2018-03-26 NOTE — Discharge Instructions (Signed)
°  Refrain from sexual activity including intercourse until lab results are back.  If you need to be treated, wait at least 7 days after treatment before resuming sexual activity.  Be sure to have all partners tested and treated for STDs.  Practice safe sex by always wearing condoms.

## 2018-03-27 LAB — C. TRACHOMATIS/N. GONORRHOEAE RNA
C. trachomatis RNA, TMA: NOT DETECTED
N. gonorrhoeae RNA, TMA: NOT DETECTED

## 2018-03-29 LAB — HIV ANTIBODY (ROUTINE TESTING W REFLEX): HIV 1&2 Ab, 4th Generation: NONREACTIVE

## 2018-03-29 LAB — RPR: RPR Ser Ql: NONREACTIVE

## 2018-03-31 ENCOUNTER — Telehealth: Payer: Self-pay | Admitting: Emergency Medicine

## 2018-03-31 NOTE — Telephone Encounter (Signed)
All labs negative. 

## 2018-10-11 ENCOUNTER — Other Ambulatory Visit: Payer: Self-pay

## 2018-10-11 ENCOUNTER — Emergency Department
Admission: EM | Admit: 2018-10-11 | Discharge: 2018-10-11 | Disposition: A | Discharge status: Home / Self Care | Payer: Self-pay | Source: Home / Self Care | Attending: Family Medicine | Admitting: Family Medicine

## 2018-10-11 DIAGNOSIS — H6123 Impacted cerumen, bilateral: Secondary | ICD-10-CM | POA: Diagnosis not present

## 2018-10-11 DIAGNOSIS — J029 Acute pharyngitis, unspecified: Secondary | ICD-10-CM | POA: Diagnosis not present

## 2018-10-11 DIAGNOSIS — L729 Follicular cyst of the skin and subcutaneous tissue, unspecified: Secondary | ICD-10-CM

## 2018-10-11 LAB — POCT RAPID STREP A (OFFICE): Rapid Strep A Screen: NEGATIVE

## 2018-10-11 MED ORDER — MUPIROCIN 2 % EX OINT: 1.0000 "application " | TOPICAL_OINTMENT | Freq: Two times a day (BID) | CUTANEOUS | 0 refills | Status: DC

## 2018-10-11 NOTE — ED Triage Notes (Signed)
Starting last week started having dark wax from the left ear.  3 days ago the right ear started hurting.  Feels like the lymph node on the right side of neck painful and swollen Bump noted on right elbow.

## 2018-10-11 NOTE — Discharge Instructions (Addendum)
Apply mupirocin ointment to cyst right elbow twice daily and keep bandaged.

## 2018-10-11 NOTE — ED Provider Notes (Signed)
[facility]    CSN: [id number] Arrival date & time: 10/11/18  1446     History   Chief Complaint Ears feel plugged.  HPI Nathaniel Lang is a 23 y.o. male.   Patient reports that his left ear felt full about one week ago and became painful 3 days ago.  He has also had a mild right sore throat for 3 days.  He complains of a bump on his right elbow.  He feels well otherwise.  The history is provided by the patient.    Past Medical History:  Diagnosis Date  . ADHD (attention deficit hyperactivity disorder)   . Constipation   . Inguinal hernia   . Seasonal allergies     Patient Active Problem List   Diagnosis Date Noted  . 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 Testis mass 10/15/2014  . ADD (attention deficit disorder) 03/17/2006  . Allergic rhinitis, seasonal 03/17/2006  . 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 ABDOMINAL SURGERY    . CIRCUMCISION    . HERNIA REPAIR    . ORCHIECTOMY Left 10/15/2014   Procedure: left ORCHIECTOMY and right orchiodopexy;  Surgeon: Carolan Clines, MD;  Location: [facility];  Service: Urology;  Laterality: Left;  . TESTICULAR EXPLORATION N/A 10/15/2014   Procedure: TESTICULAR EXPLORATION;  Surgeon: Carolan Clines, MD;  Location: [facility];  Service: Urology;  Laterality: N/A;       Home Medications    Prior to Admission medications   Medication Sig Start Date End Date Taking? Authorizing Provider  ketorolac (ACULAR) 0.5 % ophthalmic solution Place 1 drop into the left eye 4 (four) times daily. Patient not taking: Reported on 12/23/2017 11/18/17   Kandra Nicolas, MD  mupirocin ointment (BACTROBAN) 2 % Apply 1 application topically 2 (two) times daily. 10/11/18   Kandra Nicolas, MD    Family History Family History  Problem Relation Age of Onset  . Healthy Mother   . Healthy Father     Social History Social History   Tobacco  Use  . Smoking status: Never Smoker  . Smokeless tobacco: Never Used  Substance Use Topics  . Alcohol use: No  . Drug use: No     Allergies   Pertussis vaccines   Review of Systems Review of Systems + sore throat No cough No pleuritic pain No wheezing No nasal congestion No post-nasal drainage No sinus pain/pressure No itchy/red eyes + earache No hemoptysis No SOB No fever/chills No nausea No vomiting No abdominal pain No diarrhea No urinary symptoms + skin lesion right elbow No fatigue No myalgias No headache    Physical Exam Triage Vital Signs ED Triage Vitals  Enc Vitals Group     BP 10/11/18 1525 114/74     Pulse Rate 10/11/18 1525 74     Resp 10/11/18 1525 20     Temp 10/11/18 1525 98.2 F (36.8 C)     Temp Source 10/11/18 1525 Oral     SpO2 10/11/18 1525 98 %     Weight 10/11/18 1526 109 lb (49.4 kg)     Height 10/11/18 1526 5\' 3"  (1.6 m)     Head Circumference --      Peak Flow --      Pain Score 10/11/18 1526 1     Pain Loc --      Pain Edu? --      Excl.  in GC? --    No data found.  Updated Vital Signs BP 114/74 (BP Location: Right Arm)   Pulse 74   Temp 98.2 F (36.8 C) (Oral)   Resp 20   Ht 5\' 3"  (1.6 m)   Wt 49.4 kg   SpO2 98%   BMI 19.31 kg/m   Visual Acuity Right Eye Distance:   Left Eye Distance:   Bilateral Distance:    Right Eye Near:   Left Eye Near:    Bilateral Near:     Physical Exam Nursing notes and Vital Signs reviewed. Appearance:  Patient appears stated age, and in no acute distress Eyes:  Pupils are equal, round, and reactive to light and accomodation.  Extraocular movement is intact.  Conjunctivae are not inflamed  Ears:  Canals occluded with cerumen.  Post lavage, tympanic membranes slightly erythematous..  Nose:  Mildly congested turbinates.  No sinus tenderness.  Pharynx:  Normal Neck:  Supple. No adenopathy. Lungs:  Normal respiration Heart:   Normal rate.  Extremities:  Right elbow has an 8mm  diameter cystic lesion over the proximal ulna/olecranon.  The lesion is mildly erythematous but not tender to palpation.     UC Treatments / Results  Labs (all labs ordered are listed, but only abnormal results are displayed) Labs Reviewed  STREP A DNA PROBE  POCT RAPID STREP A (OFFICE) negative    Tympanometry:  Right ear tympanogram normal; Left ear tympanogram normal  EKG   Radiology No results found.  Procedures Procedures (including critical care time)  Medications Ordered in UC Medications - No data to display  Initial Impression / Assessment and Plan / UC Course  I have reviewed the triage vital signs and the nursing notes.  Pertinent labs & imaging results that were available during my care of the patient were reviewed by me and considered in my medical decision making (see chart for details).    Throat culture pending.  Patient declines I and D of cyst right elbow.  Begin mupirocin ointment. Return if not resolved one week; may need I and D   Final Clinical Impressions(s) / UC Diagnoses   Final diagnoses:  Acute pharyngitis, unspecified etiology  Bilateral impacted cerumen  Cyst of skin     Discharge Instructions     Apply mupirocin ointment to cyst right elbow twice daily and keep bandaged.    ED Prescriptions    Medication Sig Dispense Auth. Provider   mupirocin ointment (BACTROBAN) 2 %  (Status: Discontinued) Apply 1 application topically 2 (two) times daily. 22 g Lattie HawBeese, Stephen A, MD   mupirocin ointment (BACTROBAN) 2 % Apply 1 application topically 2 (two) times daily. 22 g Lattie HawBeese, Stephen A, MD        Lattie HawBeese, Stephen A, MD 10/14/18 [phone]

## 2018-10-12 ENCOUNTER — Telehealth: Payer: Self-pay

## 2018-10-12 LAB — STREP A DNA PROBE: Group A Strep Probe: NOT DETECTED

## 2018-10-12 NOTE — Telephone Encounter (Signed)
Spoke with patient.  Will follow up as needed.

## 2018-10-13 ENCOUNTER — Telehealth: Payer: Self-pay | Admitting: *Deleted

## 2018-10-13 NOTE — Telephone Encounter (Signed)
Telephone note created to enter Tympanogram order to be scanned.

## 2020-05-24 ENCOUNTER — Other Ambulatory Visit: Payer: Self-pay

## 2020-05-24 ENCOUNTER — Emergency Department
Admission: EM | Admit: 2020-05-24 | Discharge: 2020-05-24 | Disposition: A | Discharge status: Home / Self Care | Payer: BC Managed Care – PPO | Source: Home / Self Care

## 2020-05-24 DIAGNOSIS — J209 Acute bronchitis, unspecified: Secondary | ICD-10-CM | POA: Diagnosis not present

## 2020-05-24 MED ORDER — CETIRIZINE-PSEUDOEPHEDRINE ER 5-120 MG PO TB12: 1.0000 | ORAL_TABLET | Freq: Every day | ORAL | 0 refills | Status: DC

## 2020-05-24 MED ORDER — ALBUTEROL SULFATE HFA 108 (90 BASE) MCG/ACT IN AERS
2.0000 | INHALATION_SPRAY | Freq: Once | RESPIRATORY_TRACT | Status: AC
  Administered 2020-05-24: 2 via RESPIRATORY_TRACT

## 2020-05-24 NOTE — Discharge Instructions (Addendum)
You have received an albuterol inhaler in the office today. You may use 2 puffs every 4-6 hours as needed for cough, chest tightness, shortness of breath  I have sent in zyrtec D for you to take once daily for allergies and congestion  Follow up with this office or with primary care if symptoms are persisting.  Follow up in the ER for high fever, trouble swallowing, trouble breathing, other concerning symptoms.

## 2020-05-24 NOTE — ED Provider Notes (Signed)
[facility]   [id number] 05/24/20 Arrival Time: 1605   CC: COVID symptoms  SUBJECTIVE: History from: patient.  Nathaniel Lang is a 25 y.o. male who presents with cough with chest tightness since today. Denies sick exposure to COVID, flu or strep. Denies recent travel. Has negative history of Covid. Has completed Covid vaccines. Has not taken OTC medications for this. Cough and chest tightness are worse with exertion. Denies previous symptoms in the past. Denies fever, chills, fatigue, sinus pain, rhinorrhea, sore throat, SOB, wheezing, nausea, changes in bowel or bladder habits.    ROS: As per HPI.  All other pertinent ROS negative.     Past Medical History:  Diagnosis Date  . ADHD (attention deficit hyperactivity disorder)   . Constipation   . Inguinal hernia   . Seasonal allergies    Past Surgical History:  Procedure Laterality Date  . ABDOMINAL SURGERY    . CIRCUMCISION    . HERNIA REPAIR    . ORCHIECTOMY Left 10/15/2014   Procedure: left ORCHIECTOMY and right orchiodopexy;  Surgeon: Jethro Bolus, MD;  Location: [facility];  Service: Urology;  Laterality: Left;  . TESTICULAR EXPLORATION N/A 10/15/2014   Procedure: TESTICULAR EXPLORATION;  Surgeon: Jethro Bolus, MD;  Location: [facility];  Service: Urology;  Laterality: N/A;   Allergies  Allergen Reactions  . Pertussis Vaccines Other (See Comments)    Experienced seizure with dose in past.  Has been able to tolerate pertussis vaccine more recently.  unknown    No current facility-administered medications on file prior to encounter.   Current Outpatient Medications on File Prior to Encounter  Medication Sig Dispense Refill  . ketorolac (ACULAR) 0.5 % ophthalmic solution Place 1 drop into the left eye 4 (four) times daily. (Patient not taking: Reported on 12/23/2017) 3 mL 0  . mupirocin ointment (BACTROBAN) 2 % Apply 1 application topically 2 (two) times daily. 22 g 0   Social History   Socioeconomic History   . Marital status: Single    Spouse name: Not on file  . Number of children: Not on file  . Years of education: Not on file  . Highest education level: Not on file  Occupational History  . Not on file  Tobacco Use  . Smoking status: Never Smoker  . Smokeless tobacco: Never Used  Vaping Use  . Vaping Use: Never used  Substance and Sexual Activity  . Alcohol use: Yes    Comment: occ  . Drug use: No  . Sexual activity: Not on file  Other Topics Concern  . Not on file  Social History Narrative  . Not on file   Social Determinants of Health   Financial Resource Strain: Not on file  Food Insecurity: Not on file  Transportation Needs: Not on file  Physical Activity: Not on file  Stress: Not on file  Social Connections: Not on file  Intimate Partner Violence: Not on file   Family History  Problem Relation Age of Onset  . Healthy Mother   . Healthy Father     OBJECTIVE:  Vitals:   05/24/20 1617  BP: 121/70  Pulse: (!) 58  Resp: 18  Temp: 98.2 F (36.8 C)  TempSrc: Oral  SpO2: 100%     General appearance: alert; appears fatigued, but nontoxic; speaking in full sentences and tolerating own secretions HEENT: NCAT; Ears: EACs clear, TMs pearly gray; Eyes: PERRL.  EOM grossly intact. Sinuses: nontender; Nose: nares patent with clear rhinorrhea, Throat: oropharynx erythematous, cobblestoning present,  tonsils non erythematous or enlarged, uvula midline  Neck: supple without LAD Lungs: unlabored respirations, symmetrical air entry; cough: mild; no respiratory distress; mild expiratory wheezes Heart: regular rate and rhythm.  Radial pulses 2+ symmetrical bilaterally Skin: warm and dry Psychological: alert and cooperative; normal mood and affect  LABS:  No results found for this or any previous visit (from the past 24 hour(s)).   ASSESSMENT & PLAN:  1. Acute bronchitis, unspecified organism     Meds ordered this encounter  Medications  . albuterol (VENTOLIN HFA)  108 (90 Base) MCG/ACT inhaler 2 puff  . cetirizine-pseudoephedrine (ZYRTEC-D) 5-120 MG tablet    Sig: Take 1 tablet by mouth daily.    Dispense:  30 tablet    Refill:  0    Order Specific Question:   Supervising Provider    Answer:   Merrilee Jansky [[id number]]   Albuterol inhaler given in office Prescribed zyrtec D Continue supportive care at home Get plenty of rest and push fluids Use OTC zyrtec for nasal congestion, runny nose, and/or sore throat Use OTC flonase for nasal congestion and runny nose Use medications daily for symptom relief Use OTC medications like ibuprofen or tylenol as needed fever or pain Call or go to the ED if you have any new or worsening symptoms such as fever, worsening cough, shortness of breath, chest tightness, chest pain, turning blue, changes in mental status.  Reviewed expectations re: course of current medical issues. Questions answered. Outlined signs and symptoms indicating need for more acute intervention. Patient verbalized understanding. After Visit Summary given.         Moshe Cipro, NP 05/24/20 1637

## 2020-05-24 NOTE — ED Triage Notes (Signed)
Pt c/o cough and shortness of breath that started earlier today. Also c/o chest tightness with cough. Has not tried any OTC meds. No known covid exposure. Vaccinated.

## 2020-05-29 ENCOUNTER — Encounter: Payer: Self-pay | Admitting: Emergency Medicine

## 2020-05-29 ENCOUNTER — Emergency Department
Admission: EM | Admit: 2020-05-29 | Discharge: 2020-05-29 | Disposition: A | Discharge status: Home / Self Care | Payer: BC Managed Care – PPO | Source: Home / Self Care | Attending: Family Medicine | Admitting: Family Medicine

## 2020-05-29 ENCOUNTER — Other Ambulatory Visit: Payer: Self-pay

## 2020-05-29 DIAGNOSIS — J209 Acute bronchitis, unspecified: Secondary | ICD-10-CM

## 2020-05-29 MED ORDER — AZITHROMYCIN 250 MG PO TABS: ORAL_TABLET | ORAL | 0 refills | Status: DC

## 2020-05-29 MED ORDER — PREDNISONE 20 MG PO TABS: ORAL_TABLET | ORAL | 0 refills | Status: DC

## 2020-05-29 NOTE — ED Provider Notes (Signed)
[facility]    CSN: [id number] Arrival date & time: 05/29/20  0805      History   Chief Complaint Chief Complaint  Patient presents with  . Cough    HPI Nathaniel Lang is a 25 y.o. male.   Patient was evaluated and treated here five days ago for a respiratory illness.  He states that he felt better two days later, but his cough increased and became more non-productive.  He complains of tightness and pressure in his anterior chest, more pronounced on the right, but denies shortness of breath or wheezing.  He had a low grade fever to 99 initially.  He denies sore throat and nasal congestion, although he complains of a mild "sinus headache." He has had improvement with his albuterol inhaler.  He wonders if he might have mild asthma.  He states that during the past several years he would begin coughing when participating in strenuous physical activity.  He believes that his mother has asthma.  The history is provided by the patient.    Past Medical History:  Diagnosis Date  . ADHD (attention deficit hyperactivity disorder)   . Constipation   . Inguinal hernia   . Seasonal allergies     Patient Active Problem List   Diagnosis Date Noted  . 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 Testis mass 10/15/2014  . ADD (attention deficit disorder) 03/17/2006  . Allergic rhinitis, seasonal 03/17/2006  . 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 ABDOMINAL SURGERY    . CIRCUMCISION    . HERNIA REPAIR    . ORCHIECTOMY Left 10/15/2014   Procedure: left ORCHIECTOMY and right orchiodopexy;  Surgeon: Jethro Bolus, MD;  Location: [facility];  Service: Urology;  Laterality: Left;  . TESTICULAR EXPLORATION N/A 10/15/2014   Procedure: TESTICULAR EXPLORATION;  Surgeon: Jethro Bolus, MD;  Location: [facility];  Service: Urology;  Laterality: N/A;       Home Medications    Prior to  Admission medications   Medication Sig Start Date End Date Taking? Authorizing Provider  azithromycin (ZITHROMAX Z-PAK) 250 MG tablet Take 2 tabs today; then begin one tab once daily for 4 more days. 05/29/20  Yes Lattie Haw, MD  predniSONE (DELTASONE) 20 MG tablet Take one tab by mouth twice daily for 4 days, then one daily for 3 days. Take with food. 05/29/20  Yes Lattie Haw, MD  cetirizine-pseudoephedrine (ZYRTEC-D) 5-120 MG tablet Take 1 tablet by mouth daily. 05/24/20   Moshe Cipro, NP    Family History Family History  Problem Relation Age of Onset  . Healthy Mother   . Healthy Father     Social History Social History   Tobacco Use  . Smoking status: Never Smoker  . Smokeless tobacco: Never Used  Vaping Use  . Vaping Use: Never used  Substance Use Topics  . Alcohol use: Yes    Comment: occ  . Drug use: No     Allergies   Pertussis vaccines   Review of Systems Review of SystemsNo sore throat + cough ? pleuritic pain; has tightness in right anterior chest No wheezing ? nasal congestion No post-nasal drainage No sinus pain/pressure No itchy/red eyes No earache No hemoptysis No SOB No fever/chills at present; had low grade fever 99 initially No nausea No vomiting No abdominal pain No diarrhea No urinary symptoms No skin rash + fatigue No myalgias +  headache    Physical Exam Triage Vital Signs ED Triage Vitals  Enc Vitals Group     BP 05/29/20 0818 115/70     Pulse Rate 05/29/20 0818 82     Resp 05/29/20 0818 18     Temp 05/29/20 0818 98 F (36.7 C)     Temp Source 05/29/20 0818 Oral     SpO2 05/29/20 0818 98 %     Weight 05/29/20 0819 120 lb (54.4 kg)     Height 05/29/20 0819 5\' 3"  (1.6 m)     Head Circumference --      Peak Flow --      Pain Score 05/29/20 0819 3     Pain Loc --      Pain Edu? --      Excl. in GC? --    No data found.  Updated Vital Signs BP 115/70 (BP Location: Right Arm)   Pulse 82   Temp 98 F  (36.7 C) (Oral)   Resp 18   Ht 5\' 3"  (1.6 m)   Wt 54.4 kg   SpO2 98%   BMI 21.26 kg/m   Visual Acuity Right Eye Distance:   Left Eye Distance:   Bilateral Distance:    Right Eye Near:   Left Eye Near:    Bilateral Near:     Physical Exam Chest:       Comments: Chest:  Distinct tenderness to palpation over the sternum as noted on diagram.    Nursing notes and Vital Signs reviewed. Appearance:  Patient appears stated age, and in no acute distress Eyes:  Pupils are equal, round, and reactive to light and accomodation.  Extraocular movement is intact.  Conjunctivae are not inflamed  Ears:  Right canal occluded with cerumen. Left canal and tympanic membrane normal. Nose:  Mildly congested turbinates.  No sinus tenderness.   Pharynx:  Normal Neck:  Supple.  Mildly enlarged lateral nodes are present, tender to palpation on the left.   Lungs:  Clear to auscultation.  Breath sounds are equal.  Moving air well. Chest:  Distinct tenderness to palpation over the mid-sternum and right anterior chest. Heart:  Regular rate and rhythm without murmurs, rubs, or gallops.  Abdomen:  Nontender without masses or hepatosplenomegaly.  Bowel sounds are present.  No CVA or flank tenderness.  Extremities:  No edema.  Skin:  No rash present.   UC Treatments / Results  Labs (all labs ordered are listed, but only abnormal results are displayed) Labs Reviewed - No data to display  EKG   Radiology No results found.  Procedures Procedures (including critical care time)  Medications Ordered in UC Medications - No data to display  Initial Impression / Assessment and Plan / UC Course  I have reviewed the triage vital signs and the nursing notes.  Pertinent labs & imaging results that were available during my care of the patient were reviewed by me and considered in my medical decision making (see chart for details).    Patient's anterior chest discomfort appears to be  constochondritis. Will cover for bacterial bronchitis.  Suspect mild reactive airways disease.  Begin prednisone burst/taper. Followup with Family Doctor if not improved in 7 to 10 days.   Final Clinical Impressions(s) / UC Diagnoses   Final diagnoses:  Acute bronchitis, unspecified organism     Discharge Instructions     Take plain guaifenesin (1200mg  extended release tabs such as Mucinex) twice daily, with plenty of water, for cough and congestion.  Get adequate rest.   May take Delsym Cough Suppressant ("12 Hour Cough Relief") at bedtime for nighttime cough.  May continue albuterol inhaler. Stop all antihistamines (Zyrtec, etc) for now, and other non-prescription cough/cold preparations.      ED Prescriptions    Medication Sig Dispense Auth. Provider   azithromycin (ZITHROMAX Z-PAK) 250 MG tablet Take 2 tabs today; then begin one tab once daily for 4 more days. 6 tablet Lattie Haw, MD   predniSONE (DELTASONE) 20 MG tablet Take one tab by mouth twice daily for 4 days, then one daily for 3 days. Take with food. 11 tablet Lattie Haw, MD        Lattie Haw, MD 05/29/20 [phone]

## 2020-05-29 NOTE — Discharge Instructions (Addendum)
Take plain guaifenesin (1200mg  extended release tabs such as Mucinex) twice daily, with plenty of water, for cough and congestion. Get adequate rest.   May take Delsym Cough Suppressant ("12 Hour Cough Relief") at bedtime for nighttime cough.  May continue albuterol inhaler. Stop all antihistamines (Zyrtec, etc) for now, and other non-prescription cough/cold preparations.

## 2020-05-29 NOTE — ED Triage Notes (Signed)
Productive cough, chest tightness was better this weekend but worse this morning.

## 2021-03-21 ENCOUNTER — Other Ambulatory Visit: Payer: Self-pay

## 2021-03-21 ENCOUNTER — Emergency Department (INDEPENDENT_AMBULATORY_CARE_PROVIDER_SITE_OTHER)
Admission: EM | Admit: 2021-03-21 | Discharge: 2021-03-21 | Disposition: A | Discharge status: Home / Self Care | Payer: 59 | Source: Home / Self Care

## 2021-03-21 DIAGNOSIS — R059 Cough, unspecified: Secondary | ICD-10-CM | POA: Diagnosis not present

## 2021-03-21 DIAGNOSIS — J0100 Acute maxillary sinusitis, unspecified: Secondary | ICD-10-CM

## 2021-03-21 MED ORDER — BENZONATATE 200 MG PO CAPS: 200.0000 mg | ORAL_CAPSULE | Freq: Three times a day (TID) | ORAL | 0 refills | Status: AC | PRN

## 2021-03-21 MED ORDER — CEFDINIR 300 MG PO CAPS: 300.0000 mg | ORAL_CAPSULE | Freq: Two times a day (BID) | ORAL | 0 refills | Status: AC

## 2021-03-21 MED ORDER — PREDNISONE 20 MG PO TABS: ORAL_TABLET | ORAL | 0 refills | Status: DC

## 2021-03-21 NOTE — ED Triage Notes (Signed)
Pt presents with cough and congestion and loss of voice x1 week

## 2021-03-21 NOTE — ED Provider Notes (Signed)
[facility]    CSN: [id number] Arrival date & time: 03/21/21  1607      History   Chief Complaint Chief Complaint  Patient presents with   Cough   Nasal Congestion    HPI ZELIG GACEK is a 26 y.o. male.   HPI 26 year old male presents with cough, congestion and loss of voice for 1 week.  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 Authorizing Provider  benzonatate (TESSALON) 200 MG capsule Take 1 capsule (200 mg total) by mouth 3 (three) times daily as needed for up to 7 days for cough. 03/21/21 03/28/21 Yes Trevor Iha, FNP  cefdinir (OMNICEF) 300 MG capsule Take 1 capsule (300 mg total) by mouth 2 (two) times daily for 7 days. 03/21/21 03/28/21 Yes Trevor Iha, FNP  Dextromethorphan HBr (TUSSIN COUGH PO) Take by mouth.   Yes [provider]  Dextromethorphan-guaiFENesin (MUCINEX DM) 30-600 MG TB12 Take by mouth.   Yes [provider]  fluticasone (FLONASE) 50 MCG/ACT nasal spray Place into both nostrils  daily.   Yes [provider]  predniSONE (DELTASONE) 20 MG tablet Take 2 tabs PO daily x 5 days. 03/21/21  Yes Trevor Iha, FNP    Family History Family History  Problem Relation Age of Onset   Healthy Mother    Healthy Father     Social History Social History   Tobacco Use   Smoking status: Never   Smokeless tobacco: Never  Vaping Use   Vaping Use: Never used  Substance Use Topics   Alcohol use: Yes    Comment: occ   Drug use: No     Allergies   Pertussis vaccines   Review of Systems Review of Systems  HENT:  Positive for congestion and voice change.   Respiratory:  Positive for cough.   All other systems reviewed and are negative.   Physical Exam Triage Vital Signs ED Triage Vitals [03/21/21 1620]  Enc Vitals Group     BP 128/84     Pulse Rate 75     Resp 14     Temp 98.3 F (36.8 C)     Temp Source Oral     SpO2 100 %     Weight 120 lb (54.4 kg)     Height      Head Circumference      Peak Flow      Pain Score 0     Pain Loc  Pain Edu?      Excl. in GC?    No data found.  Updated Vital Signs BP 128/84 (BP Location: Left Arm)    Pulse 75    Temp 98.3 F (36.8 C) (Oral)    Resp 14    Wt 120 lb (54.4 kg)    SpO2 100%    BMI 21.26 kg/m    Physical Exam Vitals reviewed.  Constitutional:      General: He is not in acute distress.    Appearance: Normal appearance. He is normal weight. He is not ill-appearing.  HENT:     Head: Normocephalic and atraumatic.     Right Ear: Tympanic membrane, ear canal and external ear normal.     Left Ear: Tympanic membrane, ear canal and external ear normal.     Mouth/Throat:     Mouth: Mucous membranes are moist.     Pharynx: Oropharynx is clear.  Eyes:     Extraocular Movements: Extraocular movements intact.     Conjunctiva/sclera: Conjunctivae normal.     Pupils: Pupils are equal, round, and reactive to light.  Cardiovascular:     Rate and Rhythm: Normal rate and regular rhythm.     Pulses:  Normal pulses.     Heart sounds: Normal heart sounds.  Pulmonary:     Effort: Pulmonary effort is normal.     Breath sounds: Normal breath sounds.  Musculoskeletal:     Cervical back: Normal range of motion and neck supple.  Skin:    General: Skin is warm and dry.  Neurological:     General: No focal deficit present.     Mental Status: He is alert and oriented to person, place, and time.     UC Treatments / Results  Labs (all labs ordered are listed, but only abnormal results are displayed) Labs Reviewed - No data to display  EKG   Radiology No results found.  Procedures Procedures (including critical care time)  Medications Ordered in UC Medications - No data to display  Initial Impression / Assessment and Plan / UC Course  I have reviewed the triage vital signs and the nursing notes.  Pertinent labs & imaging results that were available during my care of the patient were reviewed by me and considered in my medical decision making (see chart for details).     MDM: 1.  Subacute maxillary sinusitis-Rx'd Cefdinir; 2.  Cough-Rx'd Prednisone and Tessalon Perles. Advised patient to take medication as directed with food to completion.  Advised patient to take Prednisone with first dose of Cefdinir for the next 5 of 7 days.  Advised may use Tessalon Perles daily or as needed for cough.  Encouraged patient to increase daily water intake while taking these medications.  Patient discharged home, hemodynamically stable. Final Clinical Impressions(s) / UC Diagnoses   Final diagnoses:  Cough, unspecified type  Subacute maxillary sinusitis     Discharge Instructions      Advised patient to take medication as directed with food to completion.  Advised patient to take Prednisone with first dose of Cefdinir for the next 5 of 7 days.  Advised may use Tessalon Perles daily or as needed for cough.  Encouraged patient to increase daily water intake while taking these  medications.     ED Prescriptions     Medication Sig Dispense Auth. Provider   cefdinir (OMNICEF) 300 MG capsule Take 1 capsule (300 mg total) by mouth 2 (two) times daily for 7 days. 14 capsule Dafna Romo,  Casimiro NeedleMichael, FNP   predniSONE (DELTASONE) 20 MG tablet Take 2 tabs PO daily x 5 days. 10 tablet Trevor Ihaagan, Berish Bohman, FNP   benzonatate (TESSALON) 200 MG capsule Take 1 capsule (200 mg total) by mouth 3 (three) times daily as needed for up to 7 days for cough. 40 capsule Trevor Ihaagan, Mikena Masoner, FNP      PDMP not reviewed this encounter.   Trevor IhaRagan, Consepcion Utt, FNP 03/21/21 1729

## 2021-03-21 NOTE — Discharge Instructions (Addendum)
Advised patient to take medication as directed with food to completion.  Advised patient to take Prednisone with first dose of Cefdinir for the next 5 of 7 days.  Advised may use Tessalon Perles daily or as needed for cough.  Encouraged patient to increase daily water intake while taking these medications.

## 2021-04-23 ENCOUNTER — Ambulatory Visit: Payer: 59 | Admitting: Family Medicine

## 2022-01-24 ENCOUNTER — Ambulatory Visit
Admission: EM | Admit: 2022-01-24 | Discharge: 2022-01-24 | Disposition: A | Discharge status: Home / Self Care | Payer: 59 | Attending: Family Medicine | Admitting: Family Medicine

## 2022-01-24 DIAGNOSIS — J069 Acute upper respiratory infection, unspecified: Secondary | ICD-10-CM | POA: Diagnosis not present

## 2022-01-24 MED ORDER — AZITHROMYCIN 250 MG PO TABS: ORAL_TABLET | ORAL | 0 refills | Status: DC

## 2022-01-24 MED ORDER — PREDNISONE 20 MG PO TABS: ORAL_TABLET | ORAL | 0 refills | Status: DC

## 2022-01-24 NOTE — ED Provider Notes (Signed)
[facility]    CSN: [id number] Arrival date & time: 01/24/22  0930      History   Chief Complaint Chief Complaint  Patient presents with   Cough   Facial Pain   Nasal Congestion    HPI Nathaniel Lang is a 26 y.o. male.   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 Authorizing Provider  azithromycin (ZITHROMAX Z-PAK) 250 MG tablet Take 2 tabs today; then begin one tab once daily for 4 more days. 01/24/22  Yes Lattie Haw, MD  ibuprofen (ADVIL) 200 MG tablet Take 200 mg by mouth every 6 (six) hours as needed.   Yes [provider]  predniSONE (DELTASONE) 20 MG tablet Take one tab by mouth twice daily for 4 days, then one daily for 3 days. Take with food. 01/24/22  Yes Lattie Haw, MD  Dextromethorphan HBr (TUSSIN COUGH PO) Take by mouth.    [provider]  fluticasone (FLONASE) 50 MCG/ACT nasal spray Place into both nostrils daily.    [provider]    Family History Family History   Problem Relation Age of Onset   Healthy Mother    Healthy Father     Social History Social History   Tobacco Use   Smoking status: Never   Smokeless tobacco: Never  Vaping Use   Vaping Use: Never used  Substance Use Topics   Alcohol use: Yes    Comment: occ   Drug use: No     Allergies   Pertussis vaccines   Review of Systems Review of Systems   Physical Exam Triage Vital Signs ED Triage Vitals  Enc Vitals Group     BP 01/24/22 1058 119/79     Pulse Rate 01/24/22 1058 99     Resp 01/24/22 1058 20     Temp 01/24/22 1058 99 F (37.2 C)     Temp Source 01/24/22 1058 Oral     SpO2 01/24/22 1058 98 %     Weight 01/24/22 1054 125 lb (56.7 kg)     Height 01/24/22 1054 5\' 3"  (1.6 m)     Head Circumference --      Peak Flow --      Pain Score 01/24/22 1052 2     Pain Loc --      Pain Edu? --      Excl. in GC? --    No data found.  Updated Vital Signs BP 119/79 (BP  Location: Right Arm)   Pulse 99   Temp 99 F (37.2 C) (Oral)   Resp 20   Ht 5\' 3"  (1.6 m)   Wt 56.7 kg   SpO2 98%   BMI 22.14 kg/m   Visual Acuity Right Eye Distance:   Left Eye Distance:   Bilateral Distance:    Right Eye Near:   Left Eye Near:    Bilateral Near:     Physical Exam   UC Treatments / Results  Labs (all labs ordered are listed, but only abnormal results are displayed) Labs Reviewed - No data to display  EKG   Radiology No results found.  Procedures Procedures (including critical care time)  Medications Ordered in UC Medications - No data to display  Initial Impression / Assessment and Plan / UC Course  I have reviewed the triage vital signs and the nursing notes.  Pertinent labs & imaging results that were available during my care of the patient were reviewed by me and considered in my medical decision making (see chart for details).    Begin Z-pack and prednisone burst/taper. Followup with Family Doctor if not improved in about 10 days.  Final  Clinical Impressions(s) / UC Diagnoses   Final diagnoses:  Viral URI with cough     Discharge Instructions      Take plain guaifenesin (1200mg  extended release tabs such as Mucinex) twice daily, with plenty of water, for cough and congestion.  May add Pseudoephedrine (30mg , one or two every 4 to 6 hours) for sinus congestion.  Get adequate rest.   May use Afrin nasal spray (or generic oxymetazoline) each morning for about 5 days and then discontinue.  Also recommend using saline nasal spray several times daily and saline nasal irrigation (AYR is a common brand).  Use Flonase nasal spray each morning after using Afrin nasal spray and saline nasal irrigation. Try warm salt water gargles for sore throat.  Stop all antihistamines for now, and other non-prescription cough/cold preparations. May take Delsym Cough Suppressant ("12 Hour Cough Relief") at bedtime for nighttime cough.  May continue albuterol inhaler as needed.   If symptoms become significantly worse during the night or over the weekend, proceed to the local emergency room.     ED Prescriptions     Medication Sig Dispense Auth. Provider   azithromycin (ZITHROMAX Z-PAK) 250 MG tablet Take 2 tabs today; then begin one tab once daily for 4 more days. 6 tablet , MD   predniSONE (DELTASONE) 20 MG tablet Take one tab by mouth twice daily for 4 days, then one daily for 3 days. Take with food. 11 tablet , MD      PDMP not reviewed this encounter.

## 2022-01-24 NOTE — ED Triage Notes (Signed)
Pt presents to Urgent Care with c/o nasal congestion, sinus pressure, and cough (w/ chest tightness) x 4 days. Negative home COVID test x 2.

## 2022-01-24 NOTE — Discharge Instructions (Signed)
Take plain guaifenesin (1200mg  extended release tabs such as Mucinex) twice daily, with plenty of water, for cough and congestion.  May add Pseudoephedrine (30mg , one or two every 4 to 6 hours) for sinus congestion.  Get adequate rest.   May use Afrin nasal spray (or generic oxymetazoline) each morning for about 5 days and then discontinue.  Also recommend using saline nasal spray several times daily and saline nasal irrigation (AYR is a common brand).  Use Flonase nasal spray each morning after using Afrin nasal spray and saline nasal irrigation. Try warm salt water gargles for sore throat.  Stop all antihistamines for now, and other non-prescription cough/cold preparations. May take Delsym Cough Suppressant ("12 Hour Cough Relief") at bedtime for nighttime cough.  May continue albuterol inhaler as needed.   If symptoms become significantly worse during the night or over the weekend, proceed to the local emergency room.

## 2022-05-14 ENCOUNTER — Encounter: Payer: Self-pay | Admitting: Emergency Medicine

## 2022-05-14 ENCOUNTER — Ambulatory Visit
Admission: EM | Admit: 2022-05-14 | Discharge: 2022-05-14 | Disposition: A | Discharge status: Home / Self Care | Payer: 59 | Attending: Emergency Medicine | Admitting: Emergency Medicine

## 2022-05-14 DIAGNOSIS — J069 Acute upper respiratory infection, unspecified: Secondary | ICD-10-CM

## 2022-05-14 MED ORDER — PROMETHAZINE-DM 6.25-15 MG/5ML PO SYRP: 5.0000 mL | ORAL_SOLUTION | Freq: Every evening | ORAL | 0 refills | Status: DC | PRN

## 2022-05-14 MED ORDER — CETIRIZINE HCL 10 MG PO TABS: 10.0000 mg | ORAL_TABLET | Freq: Every day | ORAL | 1 refills | Status: DC

## 2022-05-14 MED ORDER — IPRATROPIUM BROMIDE 0.06 % NA SOLN: 2.0000 | Freq: Three times a day (TID) | NASAL | 1 refills | Status: DC

## 2022-05-14 MED ORDER — GUAIFENESIN 400 MG PO TABS: ORAL_TABLET | ORAL | 0 refills | Status: DC

## 2022-05-14 MED ORDER — FLUTICASONE PROPIONATE 50 MCG/ACT NA SUSP: 1.0000 | Freq: Every day | NASAL | 2 refills | Status: DC

## 2022-05-14 NOTE — ED Triage Notes (Signed)
Patient c/o sinus congestion, productive cough w/yellow phlegm, low grade fever x 5 days.  Patient does feel achy.  Patient has taken Tylenol and Flonase.

## 2022-05-14 NOTE — ED Provider Notes (Addendum)
[facility]    CSN: [id number] Arrival date & time: 05/14/22  1313    HISTORY   Chief Complaint  Patient presents with   Sinus Congestion   HPI Nathaniel Lang is a pleasant, 27 y.o. male who presents to urgent care today. Patient c/o sinus congestion, productive cough w/yellow phlegm, low grade fever (Tmax 99.3), body aches x 2 days.  Patient states has taken Tylenol and Flonase without meaningful relief of symptoms.  Patient reports a history of allergies that are seasonal, not currently taking any allergy medications.  EMR reviewed by me, patient has a history of frequent episodes of sinusitis, bronchitis and respiratory tract infections.  The history is provided by the patient.   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 Authorizing Provider  fluticasone (FLONASE) 50 MCG/ACT nasal spray Place into both nostrils daily.   Yes [provider]  azithromycin (ZITHROMAX Z-PAK) 250 MG tablet Take 2 tabs today; then begin one tab once  daily for 4 more days. 01/24/22   Kandra Nicolas, MD  Dextromethorphan HBr (TUSSIN COUGH PO) Take by mouth.    [provider]  ibuprofen (ADVIL) 200 MG tablet Take 200 mg by mouth every 6 (six) hours as needed.    [provider]  predniSONE (DELTASONE) 20 MG tablet Take one tab by mouth twice daily for 4 days, then one daily for 3 days. Take with food. 01/24/22   Kandra Nicolas, MD    Family History Family History  Problem Relation Age of Onset   Healthy Mother    Healthy Father    Social History Social History   Tobacco Use   Smoking status: Never   Smokeless tobacco: Never  Vaping Use   Vaping Use: Never used  Substance Use Topics   Alcohol use: Yes    Comment: occ   Drug use: No   Allergies   Pertussis vaccines  Review of Systems Review of Systems Pertinent findings revealed after performing a 14 point review of systems has been noted in the history of present illness.  Physical Exam Vital Signs BP 131/82 (BP Location: Right Arm)   Pulse (!) 111   Temp 99.3 F (37.4 C) (Oral)   Resp 18   Ht '5\' 3"'$  (1.6 m)   Wt 135 lb (61.2 kg)   SpO2 95%   BMI 23.91 kg/m   No data found.  Physical Exam Vitals and nursing note reviewed.  Constitutional:      General: He is not  in acute distress.    Appearance: Normal appearance. He is not ill-appearing.  HENT:     Head: Normocephalic and atraumatic.     Salivary Glands: Right salivary gland is not diffusely enlarged or tender. Left salivary gland is not diffusely enlarged or tender.     Right Ear: Ear canal and external ear normal. No drainage. No middle ear effusion. There is no impacted cerumen. Tympanic membrane is bulging. Tympanic membrane is not injected or erythematous.     Left Ear: Ear canal and external ear normal. No drainage.  No middle ear effusion. There is no impacted cerumen. Tympanic membrane is bulging. Tympanic membrane is not injected or erythematous.     Ears:     Comments:  Bilateral EACs normal, both TMs bulging with clear fluid    Nose: Rhinorrhea present. No nasal deformity, septal deviation, signs of injury, nasal tenderness, mucosal edema or congestion. Rhinorrhea is clear.     Right Nostril: Occlusion present. No foreign body, epistaxis or septal hematoma.     Left Nostril: Occlusion present. No foreign body, epistaxis or septal hematoma.     Right Turbinates: Enlarged and swollen. Not pale.     Left Turbinates: Enlarged and swollen. Not pale.     Right Sinus: No maxillary sinus tenderness or frontal sinus tenderness.     Left Sinus: No maxillary sinus tenderness or frontal sinus tenderness.     Mouth/Throat:     Lips: Pink. No lesions.     Mouth: Mucous membranes are moist. No oral lesions.     Tongue: No lesions. Tongue does not deviate from midline.     Palate: No mass and lesions.     Pharynx: Oropharynx is clear. Uvula midline. No pharyngeal swelling, oropharyngeal exudate, posterior oropharyngeal erythema or uvula swelling.     Tonsils: No tonsillar exudate or tonsillar abscesses. 0 on the right. 0 on the left.     Comments: ++Postnasal drip Eyes:     General: Lids are normal.        Right eye: No discharge.        Left eye: No discharge.     Extraocular Movements: Extraocular movements intact.     Conjunctiva/sclera: Conjunctivae normal.     Right eye: Right conjunctiva is not injected.     Left eye: Left conjunctiva is not injected.  Neck:     Trachea: Trachea and phonation normal.  Cardiovascular:     Rate and Rhythm: Normal rate and regular rhythm.     Pulses: Normal pulses.     Heart sounds: Normal heart sounds. No murmur heard.    No friction rub. No gallop.  Pulmonary:     Effort: Pulmonary effort is normal. No accessory muscle usage, prolonged expiration or respiratory distress.     Breath sounds: Normal breath sounds. No stridor, decreased air movement or transmitted upper airway sounds. No decreased breath sounds, wheezing, rhonchi  or rales.  Chest:     Chest wall: No tenderness.  Musculoskeletal:        General: Normal range of motion.     Cervical back: Normal range of motion and neck supple. Normal range of motion.  Lymphadenopathy:     Cervical: No cervical adenopathy.  Skin:    General: Skin is warm and dry.     Findings: No erythema or rash.  Neurological:     General: No focal deficit present.     Mental Status: He is alert and oriented to person, place, and time.  Psychiatric:        Mood and Affect: Mood normal.        Behavior: Behavior normal.     Visual Acuity Right Eye Distance:   Left Eye Distance:   Bilateral Distance:    Right Eye Near:   Left Eye Near:    Bilateral Near:     UC Couse / Diagnostics / Procedures:     Radiology No results found.  Procedures Procedures (including critical care time) EKG  Pending results:  Labs Reviewed - No data to display  Medications Ordered in UC: Medications - No data to display  UC Diagnoses / Final Clinical Impressions(s)   I have reviewed the triage vital signs and the nursing notes.  Pertinent labs & imaging results that were available during my care of the patient were reviewed by me and considered in my medical decision making (see chart for details).    Final diagnoses:  Viral URI with cough   Patient advised that physical exam findings are concerning for uncontrolled respiratory allergies that may have caused him to obtain a viral infection.  Recommend beginning allergy medications at this time, Robitussin recommended to help cough be more productive and easier, patient also advised for nighttime cough can take Promethazine DM.  Conservative care recommended.  Return precautions advised. Please see discharge instructions below for further details of plan of care as provided to patient. ED Prescriptions     Medication Sig Dispense Auth. Provider   cetirizine (ZYRTEC ALLERGY) 10 MG tablet Take 1 tablet (10 mg total) by mouth at  bedtime. 90 tablet Lynden Oxford Scales, PA-C   fluticasone (FLONASE) 50 MCG/ACT nasal spray Place 1 spray into both nostrils daily. Begin by using 2 sprays in each nare daily for 3 to 5 days, then decrease to 1 spray in each nare daily. 15.8 mL Lynden Oxford Scales, PA-C   ipratropium (ATROVENT) 0.06 % nasal spray Place 2 sprays into both nostrils 3 (three) times daily. As needed for nasal congestion, runny nose 15 mL Lynden Oxford Scales, PA-C   guaifenesin (HUMIBID E) 400 MG TABS tablet Take 1 tablet 3 times daily as needed for chest congestion and cough 30 tablet Lynden Oxford Scales, PA-C   promethazine-dextromethorphan (PROMETHAZINE-DM) 6.25-15 MG/5ML syrup Take 5 mLs by mouth at bedtime as needed for cough. 60 mL Lynden Oxford Scales, PA-C      PDMP not reviewed this encounter.  Disposition Upon Discharge:  Condition: stable for discharge home Home: take medications as prescribed; routine discharge instructions as discussed; follow up as advised.  Patient presented with an acute illness with associated systemic symptoms and significant discomfort requiring urgent management. In my opinion, this is a condition that a prudent lay person (someone who possesses an average knowledge of health and medicine) may potentially expect to result in complications if not addressed urgently such as respiratory distress, impairment of bodily function or dysfunction of bodily organs.   Routine symptom specific, illness specific and/or disease specific instructions were discussed with the patient and/or caregiver at length.   As such, the patient has been evaluated and assessed, work-up was performed and treatment was provided in alignment with urgent care protocols and evidence based medicine.  Patient/parent/caregiver has been advised that the patient may require follow up for further testing and treatment if the symptoms continue in spite of treatment, as clinically indicated and  appropriate.  If the patient was tested for COVID-19, Influenza and/or RSV, then the patient/parent/guardian was advised to isolate at home  pending the results of his/her diagnostic coronavirus test and potentially longer if they're positive. I have also advised pt that if his/her COVID-19 test returns positive, it's recommended to self-isolate for at least 10 days after symptoms first appeared AND until fever-free for 24 hours without fever reducer AND other symptoms have improved or resolved. Discussed self-isolation recommendations as well as instructions for household member/close contacts as per the [facility] and [place] [facility], and also gave patient the [facility] packet with this information.  Patient/parent/caregiver has been advised to return to the [facility] or PCP in 3-5 days if no better; to PCP or the Emergency Department if new signs and symptoms develop, or if the current signs or symptoms continue to change or worsen for further workup, evaluation and treatment as clinically indicated and appropriate  The patient will follow up with their current PCP if and as advised. If the patient does not currently have a PCP we will assist them in obtaining one.   The patient may need specialty follow up if the symptoms continue, in spite of conservative treatment and management, for further workup, evaluation, consultation and treatment as clinically indicated and appropriate.  Patient/parent/caregiver verbalized understanding and agreement of plan as discussed.  All questions were addressed during visit.  Please see discharge instructions below for further details of plan.  Discharge Instructions:   Discharge Instructions      Your symptoms and physical exam findings are concerning for a viral respiratory infection.  Because respiratory allergies are not well controlled at this time, this makes you more susceptible to catching respiratory infections.   To avoid catching frequent respiratory infections, having skin  reactions, dealing with eye irritation, losing sleep, missing work, etc., due to uncontrolled allergies, it is important that you begin/continue your allergy regimen and are consistent with taking your meds exactly as prescribed.   Please read below to learn more about the medications, dosages and frequencies that I recommend to help alleviate your symptoms and to get you feeling better soon:   Zyrtec (cetirizine): This is an excellent second-generation antihistamine that helps to reduce respiratory inflammatory response to environmental allergens.  In some patients, this medication can cause daytime sleepiness so I recommend that you take 1 tablet daily at bedtime.    Flonase (fluticasone): This is a steroid nasal spray that used once daily, 1 spray in each nare.  This works best when used on a daily basis. This medication does not work well if it is only used when you think you need it.  After 3 to 5 days of use, you will notice significant reduction of the inflammation and mucus production that is currently being caused by exposure to allergens, whether seasonal or environmental.  The most common side effect of this medication is nosebleeds.  If you experience a nosebleed, please discontinue use for 1 week, then feel free to resume.  If you find that your insurance will not pay for this medication, please consider a different nasal steroids such as Nasonex (mometasone), or Nasacort (triamcinolone).   Atrovent (ipratropium): This is an excellent nasal decongestant spray that will not cause rebound congestion.  Please instill 2 sprays into each nare after using the nasal steroid and repeat 2 more times throughout the day.  I have added this nasal spray to the nasal steroid because nasal steroids can take several days to reach full effect.  Once you find that you are forgetting to use the spray more often than you remember to use it, you will  know that you no longer need it.     Robitussin, Mucinex  (guaifenesin): This is an expectorant.  This helps break up chest congestion and loosen up thick nasal drainage making phlegm and drainage more liquid and therefore easier to remove.  I recommend being 400 mg three times daily as needed.      Promethazine DM: Promethazine is both a nasal decongestant and an antinausea medication that makes most patients feel fairly sleepy.  The DM is dextromethorphan, a cough suppressant found in many over-the-counter cough medications.  Please take 5 mL before bedtime to minimize your cough which will help you sleep better.  I have sent a prescription for this medication to your pharmacy.   If symptoms have not meaningfully improved in the next 5 to 7 days, please return for repeat evaluation or follow-up with your regular provider.  If symptoms have worsened in the next 3 to 5 days, please return for repeat evaluation or follow-up with your regular provider.    Thank you for visiting urgent care today.  We appreciate the opportunity to participate in your care.      This office note has been dictated using Museum/gallery curator.  Unfortunately, this method of dictation can sometimes lead to typographical or grammatical errors.  I apologize for your inconvenience in advance if this occurs.  Please do not hesitate to reach out to me if clarification is needed.      Lynden Oxford Scales, PA-C 05/16/22 1017    Lynden Oxford [place], [place] 05/16/22 1018

## 2022-05-14 NOTE — Discharge Instructions (Signed)
Your symptoms and physical exam findings are concerning for a viral respiratory infection.  Because respiratory allergies are not well controlled at this time, this makes you more susceptible to catching respiratory infections.   To avoid catching frequent respiratory infections, having skin reactions, dealing with eye irritation, losing sleep, missing work, etc., due to uncontrolled allergies, it is important that you begin/continue your allergy regimen and are consistent with taking your meds exactly as prescribed.   Please read below to learn more about the medications, dosages and frequencies that I recommend to help alleviate your symptoms and to get you feeling better soon:   Zyrtec (cetirizine): This is an excellent second-generation antihistamine that helps to reduce respiratory inflammatory response to environmental allergens.  In some patients, this medication can cause daytime sleepiness so I recommend that you take 1 tablet daily at bedtime.    Flonase (fluticasone): This is a steroid nasal spray that used once daily, 1 spray in each nare.  This works best when used on a daily basis. This medication does not work well if it is only used when you think you need it.  After 3 to 5 days of use, you will notice significant reduction of the inflammation and mucus production that is currently being caused by exposure to allergens, whether seasonal or environmental.  The most common side effect of this medication is nosebleeds.  If you experience a nosebleed, please discontinue use for 1 week, then feel free to resume.  If you find that your insurance will not pay for this medication, please consider a different nasal steroids such as Nasonex (mometasone), or Nasacort (triamcinolone).   Atrovent (ipratropium): This is an excellent nasal decongestant spray that will not cause rebound congestion.  Please instill 2 sprays into each nare after using the nasal steroid and repeat 2 more times throughout the  day.  I have added this nasal spray to the nasal steroid because nasal steroids can take several days to reach full effect.  Once you find that you are forgetting to use the spray more often than you remember to use it, you will know that you no longer need it.     Robitussin, Mucinex (guaifenesin): This is an expectorant.  This helps break up chest congestion and loosen up thick nasal drainage making phlegm and drainage more liquid and therefore easier to remove.  I recommend being 400 mg three times daily as needed.      Promethazine DM: Promethazine is both a nasal decongestant and an antinausea medication that makes most patients feel fairly sleepy.  The DM is dextromethorphan, a cough suppressant found in many over-the-counter cough medications.  Please take 5 mL before bedtime to minimize your cough which will help you sleep better.  I have sent a prescription for this medication to your pharmacy.   If symptoms have not meaningfully improved in the next 5 to 7 days, please return for repeat evaluation or follow-up with your regular provider.  If symptoms have worsened in the next 3 to 5 days, please return for repeat evaluation or follow-up with your regular provider.    Thank you for visiting urgent care today.  We appreciate the opportunity to participate in your care.

## 2022-10-01 ENCOUNTER — Ambulatory Visit
Admission: EM | Admit: 2022-10-01 | Discharge: 2022-10-01 | Disposition: A | Discharge status: Home / Self Care | Payer: 59 | Attending: Family Medicine | Admitting: Family Medicine

## 2022-10-01 DIAGNOSIS — R059 Cough, unspecified: Secondary | ICD-10-CM | POA: Insufficient documentation

## 2022-10-01 DIAGNOSIS — Z20822 Contact with and (suspected) exposure to covid-19: Secondary | ICD-10-CM | POA: Insufficient documentation

## 2022-10-01 MED ORDER — BENZONATATE 200 MG PO CAPS: 200.0000 mg | ORAL_CAPSULE | Freq: Three times a day (TID) | ORAL | 0 refills | Status: AC | PRN

## 2022-10-01 NOTE — Discharge Instructions (Addendum)
Advised patient may take Tessalon Perles daily or as needed for cough.  Encouraged increase daily water intake to 64 ounces per day while taking this medication.  Advised we will follow-up with COVID-19 PCR results once received.  Advised if symptoms worsen and/or unresolved please follow-up with PCP or here for further evaluation.

## 2022-10-01 NOTE — ED Provider Notes (Signed)
[facility]    CSN: [id number] Arrival date & time: 10/01/22  1043      History   Chief Complaint Chief Complaint  Patient presents with   Cough    HPI Nathaniel Lang is a 27 y.o. male.   HPI  Pleasant 27 year old male presents with cough for 1 day.  Patient reports sinus pressure and sore throat as well.  Patient reports recent COVID-19 exposure among family members and friends. 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 Authorizing Provider  benzonatate (TESSALON) 200 MG capsule Take 1 capsule (200 mg total) by mouth 3 (three) times daily as needed for up to 7 days. 10/01/22 10/08/22 Yes Trevor Iha, FNP  cetirizine (ZYRTEC ALLERGY) 10 MG tablet Take 1 tablet (10 mg total) by mouth at bedtime. 05/14/22 11/10/22  Theadora Rama Scales, PA-C  fluticasone (FLONASE) 50 MCG/ACT nasal spray Place 1 spray into both nostrils daily. Begin by using 2 sprays in each nare daily for 3 to 5 days,  then decrease to 1 spray in each nare daily. 05/14/22   Theadora Rama Scales, PA-C    Family History Family History  Problem Relation Age of Onset   Healthy Mother    Healthy Father     Social History Social History   Tobacco Use   Smoking status: Never   Smokeless tobacco: Never  Vaping Use   Vaping status: Never Used  Substance Use Topics   Alcohol use: Yes    Comment: occ   Drug use: No     Allergies   Pertussis vaccines   Review of Systems Review of Systems  HENT:  Positive for congestion and sore throat.   Respiratory:  Positive for cough.   All other systems reviewed and are negative.    Physical Exam Triage Vital Signs ED Triage Vitals  Encounter Vitals Group     BP      Systolic BP Percentile      Diastolic BP Percentile      Pulse      Resp      Temp      Temp src      SpO2      Weight      Height      Head Circumference      Peak Flow      Pain Score      Pain Loc      Pain Education  Exclude from Growth Chart    No data found.  Updated Vital Signs BP 115/72 (BP Location: Right Arm)   Pulse (!) 104   Temp 98.6 F (37 C) (Oral)   Resp 16   SpO2 94%     Physical Exam Vitals and nursing note reviewed.  Constitutional:      Appearance: Normal appearance. He is normal weight. He is not ill-appearing.  HENT:     Head: Normocephalic and atraumatic.     Right Ear: Tympanic membrane, ear canal and external ear normal.     Left Ear: Tympanic membrane, ear canal and external ear normal.     Mouth/Throat:     Mouth: Mucous membranes are moist.     Pharynx: Oropharynx is clear.  Eyes:     Extraocular Movements: Extraocular movements intact.     Conjunctiva/sclera: Conjunctivae normal.     Pupils: Pupils are equal, round, and reactive to light.  Cardiovascular:     Rate and Rhythm: Normal rate and regular rhythm.     Pulses: Normal pulses.     Heart sounds: Normal heart sounds.  Pulmonary:     Effort: Pulmonary effort is  normal.     Breath sounds: Normal breath sounds. No wheezing, rhonchi or rales.  Musculoskeletal:        General: Normal range of motion.     Cervical back: Normal range of motion and neck supple.  Skin:    General: Skin is warm and dry.  Neurological:     General: No focal deficit present.     Mental Status: He is alert and oriented to person, place, and time. Mental status is at baseline.      UC Treatments / Results  Labs (all labs ordered are listed, but only abnormal results are displayed) Labs Reviewed  SARS CORONAVIRUS 2 (TAT 6-24 HRS)    EKG   Radiology No results found.  Procedures Procedures (including critical care time)  Medications Ordered in UC Medications - No data to display  Initial Impression / Assessment and Plan / UC Course  I have reviewed the triage vital signs and the nursing notes.  Pertinent labs & imaging results that were available during my care of the patient were reviewed by me and considered in my medical decision making (see chart for details).     MDM: 1.  Exposure to COVID-19 virus-SARS coronavirus 2 (10 6/24 hours) ordered; 2.  Cough, unspecified type-Rx'd Tessalon Perles 3 times daily, as needed. Advised patient may take Tessalon Perles daily or as needed for cough.  Encouraged increase daily water intake to 64 ounces per day while taking this medication.  Advised we will follow-up with COVID-19 PCR results once received.  Advised if symptoms worsen and/or unresolved please follow-up with PCP or here for further evaluation.  Patient discharged home, hemodynamically stable.  Final Clinical Impressions(s) / UC Diagnoses   Final diagnoses:  Cough, unspecified type  Exposure to COVID-19 virus     Discharge Instructions      Advised patient may take Tessalon Perles daily or as needed for cough.  Encouraged increase daily water intake to 64 ounces per day while taking this medication.  Advised we will follow-up with COVID-19 PCR  results once received.  Advised if symptoms worsen and/or unresolved please follow-up with PCP or here for further evaluation.     ED Prescriptions     Medication Sig Dispense Auth. Provider   benzonatate (TESSALON) 200 MG capsule Take 1 capsule (200 mg  total) by mouth 3 (three) times daily as needed for up to 7 days. 40 capsule Trevor Iha, FNP      PDMP not reviewed this encounter.   Trevor Iha, FNP 10/01/22 1154

## 2022-10-01 NOTE — ED Triage Notes (Signed)
Pt c/o productive cough since yesterday. Sinus pressure and sore throat as well. Recent COVID exposures. COVID and Flu neg at home yesterday. Taking tylenol prn with little relief.

## 2022-10-02 LAB — SARS CORONAVIRUS 2 (TAT 6-24 HRS): SARS Coronavirus 2: POSITIVE — AB

## 2023-01-18 ENCOUNTER — Emergency Department (HOSPITAL_COMMUNITY): Payer: BC Managed Care – PPO

## 2023-01-18 ENCOUNTER — Emergency Department (HOSPITAL_COMMUNITY)
Admission: EM | Admit: 2023-01-18 | Discharge: 2023-01-18 | Disposition: A | Discharge status: Home / Self Care | Payer: BC Managed Care – PPO | Attending: Emergency Medicine | Admitting: Emergency Medicine

## 2023-01-18 ENCOUNTER — Encounter: Payer: Self-pay | Admitting: Emergency Medicine

## 2023-01-18 ENCOUNTER — Ambulatory Visit
Admission: EM | Admit: 2023-01-18 | Discharge: 2023-01-18 | Disposition: A | Discharge status: Home / Self Care | Payer: BC Managed Care – PPO

## 2023-01-18 ENCOUNTER — Encounter (HOSPITAL_COMMUNITY): Payer: Self-pay

## 2023-01-18 ENCOUNTER — Other Ambulatory Visit: Payer: Self-pay

## 2023-01-18 DIAGNOSIS — N451 Epididymitis: Secondary | ICD-10-CM | POA: Insufficient documentation

## 2023-01-18 DIAGNOSIS — N50811 Right testicular pain: Secondary | ICD-10-CM | POA: Diagnosis present

## 2023-01-18 LAB — URINALYSIS, W/ REFLEX TO CULTURE (INFECTION SUSPECTED)
Bacteria, UA: NONE SEEN
Bilirubin Urine: NEGATIVE
Glucose, UA: NEGATIVE mg/dL
Hgb urine dipstick: NEGATIVE
Ketones, ur: 5 mg/dL — AB
Leukocytes,Ua: NEGATIVE
Nitrite: NEGATIVE
Protein, ur: NEGATIVE mg/dL
Specific Gravity, Urine: 1.026 (ref 1.005–1.030)
pH: 6 (ref 5.0–8.0)

## 2023-01-18 MED ORDER — DOXYCYCLINE HYCLATE 100 MG PO CAPS: 100.0000 mg | ORAL_CAPSULE | Freq: Two times a day (BID) | ORAL | 0 refills | Status: AC

## 2023-01-18 MED ORDER — CEFTRIAXONE SODIUM 500 MG IJ SOLR
500.0000 mg | Freq: Once | INTRAMUSCULAR | Status: AC
  Administered 2023-01-18: 500 mg via INTRAMUSCULAR
  Filled 2023-01-18: qty 500

## 2023-01-18 MED ORDER — ONDANSETRON 4 MG PO TBDP
4.0000 mg | ORAL_TABLET | Freq: Once | ORAL | Status: AC
  Administered 2023-01-18: 4 mg via ORAL
  Filled 2023-01-18 (×2): qty 1

## 2023-01-18 MED ORDER — OXYCODONE-ACETAMINOPHEN 5-325 MG PO TABS
2.0000 | ORAL_TABLET | Freq: Once | ORAL | Status: AC
  Administered 2023-01-18: 2 via ORAL
  Filled 2023-01-18 (×2): qty 2

## 2023-01-18 NOTE — ED Provider Notes (Signed)
[facility] AT [facility] Provider Note   CSN: [id number] Arrival date & time: 01/18/23  1247     History Chief Complaint  Patient presents with   Testicle Pain   Pelvic Pain    Nathaniel Lang is a 27 y.o. male.  Patient past history significant for inguinal hernia and testicular torsion presents to the emergency department concerns of testicular pain.  Reports significant Testicular pain on the right testicle last night while he was out about.  He suspected this may have been due to wearing some skinny jeans.  However he does report that he had sex and after having sex he began experience worsening pain in the right testicle.  States that symptoms improved after period of rest and lying down with some Tylenol.  Was pain-free throughout the night.  Once patient woke up today had some mild discomfort but this was largely unremarkable.  Had sex again this morning and states that after sex this morning had pain again.  Currently rates pain 6 out of 10.  Diagnosed with torsion in July 2016 and had left orchiectomy and right orchiopexy at that time. Has been largely symptom free during these last several years.  Denies any urinary concerns such as dysuria, hematuria, increased urinary frequency or urgency.  No concern for STIs at this time.   Testicle Pain  Pelvic Pain       Home Medications Prior to Admission medications   Medication Sig Start Date End Date Taking? Authorizing Provider  doxycycline (VIBRAMYCIN) 100 MG capsule Take 1 capsule (100 mg total) by mouth 2 (two) times daily for 10 days. 01/18/23 01/28/23 Yes Smitty Knudsen, PA-C  fluticasone (FLONASE) 50 MCG/ACT nasal spray Place 1 spray into both nostrils daily. Begin by using 2 sprays in each nare daily for 3 to 5 days, then decrease to 1 spray in each nare daily. 05/14/22  Yes Theadora Rama Scales, PA-C  cetirizine (ZYRTEC ALLERGY) 10 MG tablet Take 1 tablet (10 mg total) by mouth at bedtime.  05/14/22 11/10/22  Theadora Rama Scales, PA-C      Allergies    Pertussis vaccines    Review of Systems   Review of Systems  Genitourinary:  Positive for pelvic pain and testicular pain.  All other systems reviewed and are negative.   Physical Exam Updated Vital Signs BP (!) 142/82 (BP Location: Right Arm)   Pulse 78   Temp 97.9 F (36.6 C) (Oral)   Resp 15   Ht 5\' 3"  (1.6 m)   Wt 61.2 kg   SpO2 94%   BMI 23.91 kg/m  Physical Exam Vitals and nursing note reviewed.  Constitutional:      General: He is not in acute distress.    Appearance: He is well-developed.  HENT:     Head: Normocephalic and atraumatic.  Eyes:     Conjunctiva/sclera: Conjunctivae normal.  Cardiovascular:     Rate and Rhythm: Normal rate and regular rhythm.     Heart sounds: No murmur heard. Pulmonary:     Effort: Pulmonary effort is normal. No respiratory distress.     Breath sounds: Normal breath sounds.  Abdominal:     Palpations: Abdomen is soft.     Tenderness: There is no abdominal tenderness.  Genitourinary:    Penis: Normal and circumcised.      Testes:        Right: Tenderness and swelling present.     Comments: Right testicle with some erythema and  swelling, slight horizontal lie. Pain primarily to the inferior and posterior aspect of right testicle. Cremasteric reflex present. Left testicle surgically absent. Musculoskeletal:        General: No swelling.     Cervical back: Neck supple.  Skin:    General: Skin is warm and dry.     Capillary Refill: Capillary refill takes less than 2 seconds.  Neurological:     Mental Status: He is alert.  Psychiatric:        Mood and Affect: Mood normal.     ED Results / Procedures / Treatments   Labs (all labs ordered are listed, but only abnormal results are displayed) Labs Reviewed  URINALYSIS, W/ REFLEX TO CULTURE (INFECTION SUSPECTED) - Abnormal; Notable for the following components:      Result Value   Ketones, ur 5 (*)    All other  components within normal limits  GC/CHLAMYDIA PROBE AMP () NOT AT [facility]    EKG None  Radiology US SCROTUM W/DOPPLER  Result Date: 01/18/2023 CLINICAL DATA:  Right-sided testicular pain for a day. Previous left orchiectomy in 2016 EXAM: SCROTAL ULTRASOUND DOPPLER ULTRASOUND OF THE TESTICLES TECHNIQUE: Complete ultrasound examination of the testicles, epididymis, and other scrotal structures was performed. Color and spectral Doppler ultrasound were also utilized to evaluate blood flow to the testicles. COMPARISON:  Ultrasound 10/14/2014 FINDINGS: Right testicle Measurements: 4.0 x 1.7 x 2.9 cm. Preserved echotexture and blood flow on Doppler. Left testicle Surgically absent Right epididymis:  Heterogeneous appearance.  Hypervascularity. Left epididymis:  Absent Hydrocele:  None visualized. Varicocele:  None visualized. Pulsed Doppler interrogation of the right testicle demonstrates preserved arterial and venous flow to the testicle. Increased flow to the right epididymis IMPRESSION: Previous left orchiectomy as per history. Heterogeneous right epididymis with increased flow. Please correlate for epididymitis. Electronically Signed   By: Karen Kays M.D.   On: 01/18/2023 14:44    Procedures Procedures   Medications Ordered in ED Medications  ondansetron (ZOFRAN-ODT) disintegrating tablet 4 mg (4 mg Oral Given 01/18/23 1425)  oxyCODONE-acetaminophen (PERCOCET/ROXICET) 5-325 MG per tablet 2 tablet (2 tablets Oral Given 01/18/23 1425)  cefTRIAXone (ROCEPHIN) injection 500 mg (500 mg Intramuscular Given 01/18/23 1641)    ED Course/ Medical Decision Making/ A&P                               Medical Decision Making Amount and/or Complexity of Data Reviewed Radiology: ordered.  Risk Prescription drug management.   This patient presents to the ED for concern of testicle pain.  Differential diagnosis includes testicular torsion, epididymitis, urethritis, inguinal hernia    Additional  history obtained:  Additional history obtained from ED admission from 09/2014 in which patient was seen for testicular pain admitted for testicular torsion.   Lab Tests:  I Ordered, and personally interpreted labs.  The pertinent results include: Urinalysis**, GC/chlamydia pending   Imaging Studies ordered:  I ordered imaging studies including scrotal ultrasound I independently visualized and interpreted imaging which showed scrotal ultrasound was negative for testicular torsion and more consistent with epididymitis I agree with the radiologist interpretation   Medicines ordered and prescription drug management:  I ordered medication including Zofran, Percocet for nausea, pain Reevaluation of the patient after these medicines showed that the patient improved I have reviewed the patients home medicines and have made adjustments as needed   Problem List / ED Course:  Patient with past history significant for testicular torsion  presents the emergency department concerns of testicle pain.  Reports that he began experience right-sided testicle pain last night in the evening with no particular onset.  Then began to have notable pain worsening after having sex.  States that the pain resolved on its own after lying down and did not have any recurrence of pain throughout the evening.  Pain was again worsened this morning after having sex again.  Denies any urinary symptoms.  Currently dorsum pain is a 6 out of 10.  Given prior history of testicular torsion, will ultrasound the testicle given level of pain.  On exam, there is some erythema and swelling noted to the right testicle and tenderness is primarily to the inferior and posterior aspect of the right testicle.  There is slight horizontal lie concern for possible torsion although exam is more consistent with epididymitis.  No palpable masses on exam.  A dose of Zofran and Percocet given for nausea and pain.  Will reassess patient shortly with  ultrasound imaging pending at this time. Ultrasound negative for torsion.  Instead consistent with epididymitis.  Given that on physical exam the severe pain that patient was experiencing, I suspect this is likely source of his discomfort.  Patient has had significant relief since lying down as I believe that this is also a therapeutic indication of likely epididymitis.  He is unremarkable no signs of acute UTI however we will treat with Rocephin and doxycycline.  A dose of Rocephin IM given here in the emergency department.  The remainder prescription for doxycycline sent to patient's pharmacy for management of the epididymitis in the outpatient setting.  Also prior to patient with contact formation for [facility] so patient can reach out to them for further evaluation to ensure your symptoms are improving.  Patient is in agreement with current plan verbalized understanding return precautions.  Discharged home in stable condition.   Final Clinical Impression(s) / ED Diagnoses Final diagnoses:  Epididymitis    Rx / DC Orders ED Discharge Orders          Ordered    doxycycline (VIBRAMYCIN) 100 MG capsule  2 times daily        01/18/23 1616              Smitty Knudsen, PA-C 01/18/23 1907    Anders Simmonds T, DO 01/19/23 1004

## 2023-01-18 NOTE — Discharge Instructions (Addendum)
Advised/girlfriend patient to go to [facility] ED now for further evaluation of right testicular pain and ultrasound of scrotum for further evaluation.

## 2023-01-18 NOTE — ED Notes (Signed)
Patient is being discharged from the Urgent Care and sent to the Emergency Department via POV . Per Trevor Iha, patient is in need of higher level of care due to needing more testicular evaluation . Patient is aware and verbalizes understanding of plan of care.  Vitals:   01/18/23 1152  BP: 123/77  Pulse: 78  Resp: 18  Temp: (!) 97.5 F (36.4 C)  SpO2: 96%

## 2023-01-18 NOTE — ED Provider Notes (Signed)
[facility]    CSN: [id number] Arrival date & time: 01/18/23  1119      History   Chief Complaint Chief Complaint  Patient presents with   Groin Pain    HPI Nathaniel Lang is a 27 y.o. male.   HPI 27 year old male presents with left testicular pain since yesterday.  PMH significant for testicular torsion and mass of testes.  Patient is accompanied by his girlfriend this afternoon.  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 Authorizing Provider  cetirizine (ZYRTEC ALLERGY) 10 MG tablet Take 1 tablet (10 mg total) by mouth at bedtime. 05/14/22 11/10/22  Theadora Rama Scales, PA-C  fluticasone (FLONASE) 50 MCG/ACT nasal spray Place 1 spray into both nostrils daily. Begin by using 2 sprays in each nare daily for 3 to 5 days, then decrease to 1 spray in each nare daily. 05/14/22   Theadora Rama Scales, PA-C    Family History Family History  Problem Relation Age of Onset   Healthy Mother    Healthy Father     Social  History Social History   Tobacco Use   Smoking status: Never   Smokeless tobacco: Never  Vaping Use   Vaping status: Never Used  Substance Use Topics   Alcohol use: Yes    Comment: occ   Drug use: No     Allergies   Pertussis vaccines   Review of Systems Review of Systems  Genitourinary:  Positive for testicular pain.     Physical Exam Triage Vital Signs ED Triage Vitals  Encounter Vitals Group     BP      Systolic BP Percentile      Diastolic BP Percentile      Pulse      Resp      Temp      Temp src      SpO2      Weight      Height      Head Circumference      Peak Flow      Pain Score      Pain Loc      Pain Education      Exclude from Growth Chart    No data found.  Updated Vital Signs BP 123/77 (BP Location: Right Arm)   Pulse 78   Temp (!) 97.5 F (36.4 C) (Oral)   Resp 18   SpO2 96%    Physical Exam Vitals and nursing note reviewed.  Constitutional:      Appearance: Normal appearance.  HENT:     Head: Normocephalic and atraumatic.     Mouth/Throat:     Mouth: Mucous membranes are moist.     Pharynx: Oropharynx is clear.  Eyes:     Extraocular Movements: Extraocular movements intact.     Conjunctiva/sclera: Conjunctivae normal.     Pupils: Pupils are equal, round, and reactive to light.  Cardiovascular:     Rate and Rhythm: Normal rate and regular rhythm.     Pulses: Normal pulses.     Heart sounds: Normal heart sounds.  Pulmonary:     Effort: Pulmonary effort is normal.     Breath sounds: Normal breath sounds. No wheezing, rhonchi or rales.  Genitourinary:    Penis: Normal.      Comments: Scrotum: Left testes absent from previous surgical removal, right testicle enlarged with erythema and swelling, TTP Musculoskeletal:        General: Normal range of motion.     Cervical back: Normal range of motion and neck supple.  Skin:    General: Skin is warm and dry.  Neurological:     General: No focal deficit present.     Mental  Status: He is alert and oriented to person, place, and time. Mental status is at baseline.  Psychiatric:        Mood and Affect: Mood normal.        Behavior: Behavior normal.        Thought Content: Thought content normal.      UC Treatments / Results  Labs (all labs ordered are listed, but only abnormal results are displayed) Labs Reviewed - No data to display  EKG   Radiology No results found.  Procedures Procedures (including critical care time)  Medications Ordered in UC Medications - No data to display  Initial Impression / Assessment and Plan / UC Course  I have reviewed the triage vital signs and the nursing notes.  Pertinent labs & imaging results that were available during my care of the patient were reviewed by me and considered in my medical decision making (see chart for details).     MDM: 1.  Right testicular pain-Advised/girlfriend patient to go to [facility] ED now for further evaluation of right testicular pain and ultrasound of scrotum for further evaluation. Final Clinical Impressions(s) / UC Diagnoses   Final diagnoses:  Right testicular pain     Discharge Instructions      Advised/girlfriend patient to go to [facility] ED now for further evaluation of right testicular pain and ultrasound of scrotum for further evaluation.     ED Prescriptions   None    PDMP not reviewed this encounter.   Trevor Iha, FNP 01/18/23 1228

## 2023-01-18 NOTE — Discharge Instructions (Signed)
You were seen in the emergency department today with concerns of testicular pain and pelvic pain.  Your ultrasound was thankfully negative for any signs of testicular torsion.  However your ultrasound was consistent with findings of epididymitis.  This likely is the source of the majority of your pain and the intermittent episodes that he had been noticing of pain.  For this, you are given a dose of Rocephin here in the emergency department with a prescription for doxycycline sent to your pharmacy to take for the next 10 days.  At home for pain control, you can take Tylenol or ibuprofen as needed.  Scrotal elevation by placing a towel or some sort of soft object underneath your scrotum can help to reduce some of the pain as it will help to decrease the blood flow entering into the scrotum that is causing majority of your discomfort.  If you have any significant worsening in symptoms, please return the emergency department for repeat assessment.  Otherwise you should follow-up with your urology group within the next week.  I provided you with contact information for [facility] and you may reach out to them to schedule an outpatient evaluation.

## 2023-01-18 NOTE — ED Triage Notes (Signed)
Pt c/o right side testicular pain since yesterday afternoon, took some Tylenol with relief and started having pain again later at night up to today. Pt endorse hx of testicular torsion a while ago.

## 2023-01-18 NOTE — ED Triage Notes (Signed)
Pt c.o right sided testicular pain since last night, hx of torsion with surgical removal of left testicle. Pt took tylenol last night and it improved. Pt was intimate with his partner this morning and had an episode of severe pain.

## 2023-01-18 NOTE — ED Notes (Signed)
No reaction to the I'm rocephin injection  d/c

## 2023-01-20 LAB — GC/CHLAMYDIA PROBE AMP (~~LOC~~) NOT AT ARMC
Chlamydia: NEGATIVE
Comment: NEGATIVE
Comment: NORMAL
Neisseria Gonorrhea: NEGATIVE

## 2024-02-01 ENCOUNTER — Other Ambulatory Visit: Payer: Self-pay

## 2024-02-01 ENCOUNTER — Ambulatory Visit
Admission: EM | Admit: 2024-02-01 | Discharge: 2024-02-01 | Disposition: A | Discharge status: Home / Self Care | Attending: Family Medicine | Admitting: Family Medicine

## 2024-02-01 ENCOUNTER — Ambulatory Visit

## 2024-02-01 DIAGNOSIS — M79641 Pain in right hand: Secondary | ICD-10-CM | POA: Diagnosis not present

## 2024-02-01 DIAGNOSIS — S60221A Contusion of right hand, initial encounter: Secondary | ICD-10-CM | POA: Diagnosis not present

## 2024-02-01 NOTE — Discharge Instructions (Addendum)
 May continue ibuprofen as needed for pain Use ice and elevation to reduce swelling Wear brace until you can comfortably use your hand without it

## 2024-02-01 NOTE — ED Provider Notes (Signed)
 [facility]    CSN: [id number] Arrival date & time: 02/01/24  9070      History   Chief Complaint Chief Complaint  Patient presents with   Hand Injury    HPI Nathaniel Lang is a 28 y.o. male.   Patient is unsure of exact mechanism of injury.  He states he was swinging his hand around and and apparently hit something hard.  He has swelling and pain in the ulnar aspect of his right hand.  Pain with grasp.  Pain when he tries to lift anything like a hand tool or drill.  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, 1996    Past Surgical History:  Procedure Laterality Date   ABDOMINAL SURGERY     CIRCUMCISION     HERNIA REPAIR     ORCHIECTOMY Left 10/15/2014   Procedure: left ORCHIECTOMY and right orchiodopexy;  Surgeon: Arlena Gal, MD;  Location: [facility];  Service: Urology;  Laterality: Left;   TESTICULAR EXPLORATION N/A 10/15/2014   Procedure: TESTICULAR EXPLORATION;  Surgeon: Arlena Gal, MD;  Location: [facility];  Service: Urology;  Laterality: N/A;       Home Medications    Prior to Admission medications   Not on File    Family History Family History  Problem Relation Age of Onset   Healthy Mother    Healthy Father     Social History Social History   Tobacco Use   Smoking status: Never   Smokeless tobacco: Never  Vaping Use   Vaping status: Never Used  Substance Use Topics   Alcohol use: Yes    Comment: occ   Drug use: No     Allergies   Pertussis vaccines   Review of Systems Review of Systems See  HPI  Physical Exam Triage Vital Signs ED Triage Vitals  Encounter Vitals Group     BP 02/01/24 0953 111/74     Girls Systolic BP Percentile --      Girls Diastolic BP Percentile --      Boys Systolic BP Percentile --      Boys Diastolic BP Percentile --      Pulse Rate 02/01/24 0953 92     Resp 02/01/24 0953 18     Temp 02/01/24 0953 97.7 F (36.5 C)     Temp Source 02/01/24 0953 Oral     SpO2 02/01/24 0953 99 %     Weight 02/01/24 0955 133 lb (60.3 kg)     Height 02/01/24 0955 5' 4 (1.626 m)     Head Circumference --      Peak Flow --      Pain Score 02/01/24 0955 2     Pain Loc --      Pain Education --      Exclude from Growth Chart --    No data found.  Updated Vital Signs BP 111/74 (BP Location: Right Arm)   Pulse 92   Temp 97.7 F (36.5 C) (Oral)   Resp 18   Ht 5' 4 (1.626  m)   Wt 60.3 kg   SpO2 99%   BMI 22.83 kg/m      Physical Exam Constitutional:      General: He is not in acute distress.    Appearance: He is well-developed and normal weight.  HENT:     Head: Normocephalic and atraumatic.  Eyes:     Conjunctiva/sclera: Conjunctivae normal.     Pupils: Pupils are equal, round, and reactive to light.  Cardiovascular:     Rate and Rhythm: Normal rate.  Pulmonary:     Effort: Pulmonary effort is normal. No respiratory distress.  Abdominal:     General: There is no distension.     Palpations: Abdomen is soft.  Musculoskeletal:        General: Swelling, tenderness and signs of injury present. Normal range of motion.     Cervical back: Normal range of motion.     Comments: Swelling, bruising, tenderness localized to fifth metacarpal region of the right hand.  Good range of motion.  Tenderness to palpation of proximal metacarpal area.  Skin:    General: Skin is warm and dry.  Neurological:     Mental Status: He is alert.      UC Treatments / Results  Labs (all labs ordered are listed, but only abnormal results are displayed) Labs Reviewed -  No data to display  EKG   Radiology DG Hand Complete Right Result Date: 02/01/2024 CLINICAL DATA:  Fifth metacarpal pain after an injury. EXAM: RIGHT HAND - COMPLETE 3+ VIEW COMPARISON:  None Available. FINDINGS: No acute osseous or joint abnormality. IMPRESSION: No acute findings. Electronically Signed   By: Newell Eke M.D.   On: 02/01/2024 10:46    Procedures Procedures (including critical care time)  Medications Ordered in UC Medications - No data to display  Initial Impression / Assessment and Plan / UC Course  I have reviewed the triage vital signs and the nursing notes.  Pertinent labs & imaging results that were available during my care of the patient were reviewed by me and considered in my medical decision making (see chart for details).     Final Clinical Impressions(s) / UC Diagnoses   Final diagnoses:  Hand pain, right  Contusion of right hand, initial encounter     Discharge Instructions      May continue ibuprofen as needed for pain Use ice and elevation to reduce swelling Wear brace until you can comfortably use your hand without it      ED Prescriptions   None    PDMP not reviewed this encounter.   Maranda Jamee Jacob, MD 02/01/24 [phone]

## 2024-02-01 NOTE — ED Triage Notes (Signed)
 Pt c/o swinging right hand coming in contact with a hard object last night resulting to injury to the outer aspect of his rt hand. Pt c/o pain 2/10 currently which increases  when he lift or squeeze an object. Pt stated that he used Ibuprofen 600 mg  last night with minimal effectiveness.

## 2024-03-04 ENCOUNTER — Other Ambulatory Visit (HOSPITAL_COMMUNITY): Payer: Self-pay

## 2024-03-04 MED ORDER — TERBINAFINE HCL 250 MG PO TABS
250.0000 mg | ORAL_TABLET | Freq: Every day | ORAL | 0 refills | Status: DC
  Filled 2024-03-04: qty 30, 30d supply, fill #0
